# Patient Record
Sex: Female | Born: 1960 | Race: White | Hispanic: No | Smoking: Never smoker
Health system: Southern US, Community
[De-identification: ages and names within clinical notes are randomized; demographics above are authoritative.]

## PROBLEM LIST (undated history)

## (undated) DIAGNOSIS — F419 Anxiety disorder, unspecified: Secondary | ICD-10-CM

## (undated) DIAGNOSIS — E785 Hyperlipidemia, unspecified: Secondary | ICD-10-CM

## (undated) DIAGNOSIS — M722 Plantar fascial fibromatosis: Secondary | ICD-10-CM

## (undated) DIAGNOSIS — R7303 Prediabetes: Secondary | ICD-10-CM

## (undated) HISTORY — DX: Plantar fascial fibromatosis: M72.2

## (undated) HISTORY — DX: Hyperlipidemia, unspecified: E78.5

## (undated) HISTORY — DX: Anxiety disorder, unspecified: F41.9

## (undated) HISTORY — DX: Prediabetes: R73.03

---

## 2004-05-08 ENCOUNTER — Other Ambulatory Visit: Admission: RE | Admit: 2004-05-08 | Discharge: 2004-05-08 | Payer: Self-pay | Admitting: Obstetrics and Gynecology

## 2006-05-24 ENCOUNTER — Encounter: Admission: RE | Admit: 2006-05-24 | Discharge: 2006-05-24 | Payer: Self-pay | Admitting: Obstetrics and Gynecology

## 2006-06-11 ENCOUNTER — Other Ambulatory Visit: Admission: RE | Admit: 2006-06-11 | Discharge: 2006-06-11 | Payer: Self-pay | Admitting: Obstetrics and Gynecology

## 2007-02-03 ENCOUNTER — Ambulatory Visit (HOSPITAL_BASED_OUTPATIENT_CLINIC_OR_DEPARTMENT_OTHER): Admission: RE | Admit: 2007-02-03 | Discharge: 2007-02-03 | Payer: Self-pay | Admitting: Obstetrics & Gynecology

## 2007-02-03 HISTORY — PX: ABLATION: SHX5711

## 2007-06-03 ENCOUNTER — Encounter: Admission: RE | Admit: 2007-06-03 | Discharge: 2007-06-03 | Payer: Self-pay | Admitting: Obstetrics & Gynecology

## 2008-05-13 ENCOUNTER — Other Ambulatory Visit: Admission: RE | Admit: 2008-05-13 | Discharge: 2008-05-13 | Payer: Self-pay | Admitting: Obstetrics & Gynecology

## 2008-06-04 ENCOUNTER — Encounter: Admission: RE | Admit: 2008-06-04 | Discharge: 2008-06-04 | Payer: Self-pay | Admitting: Obstetrics & Gynecology

## 2009-06-07 ENCOUNTER — Encounter: Admission: RE | Admit: 2009-06-07 | Discharge: 2009-06-07 | Payer: Self-pay | Admitting: Obstetrics & Gynecology

## 2009-08-31 ENCOUNTER — Ambulatory Visit: Payer: Self-pay | Admitting: Sports Medicine

## 2009-08-31 DIAGNOSIS — M542 Cervicalgia: Secondary | ICD-10-CM | POA: Insufficient documentation

## 2009-08-31 DIAGNOSIS — M79609 Pain in unspecified limb: Secondary | ICD-10-CM | POA: Insufficient documentation

## 2009-08-31 DIAGNOSIS — M533 Sacrococcygeal disorders, not elsewhere classified: Secondary | ICD-10-CM | POA: Insufficient documentation

## 2010-06-21 ENCOUNTER — Encounter: Admission: RE | Admit: 2010-06-21 | Discharge: 2010-06-21 | Payer: Self-pay | Admitting: Obstetrics & Gynecology

## 2010-06-28 ENCOUNTER — Encounter: Admission: RE | Admit: 2010-06-28 | Discharge: 2010-06-28 | Payer: Self-pay | Admitting: Obstetrics & Gynecology

## 2011-03-30 NOTE — Op Note (Signed)
NAMEMordecai Gregory             ACCOUNT NO.:  [id number]   MEDICAL RECORD NO.:  [id number]          PATIENT TYPE:  AMB   LOCATION:  [facility]                         FACILITY:  [facility]   PHYSICIAN:  M. Leda Quail, MD  DATE OF BIRTH:  [birth year]   DATE OF PROCEDURE:  02/03/2007  DATE OF DISCHARGE:                               OPERATIVE REPORT   PREOPERATIVE DIAGNOSIS:  37. 50 year old G1 P1 married white female with menorrhagia.  2. Endometrial polyps on sonohysterogram.   POSTOPERATIVE DIAGNOSIS:  Menorrhagia and regression of endometrial  polyps after administration of Depo-Lupron.   PROCEDURE:  HTA   SURGEON:  M. Leda Quail, M.D.   ASSISTANT:  OR staff.   ANESTHESIA:  MAC.   SPECIMENS:  None.   ESTIMATED BLOOD LOSS:  Minimal.   FLUIDS:  1000 mL of LR.   URINE OUTPUT:  100 mL of clear urine output drained with Foley catheter  during the procedure.   INDICATIONS:  Ms. Lindsay Gregory is a 50 year old married white female who has  had one pregnancy and one child, and also has a history of menorrhagia.  She has undergone workup including a sonohysterogram which showed  several sessile polyps and an endometrial biopsy that showed benign  proliferative endometrium.  The patient was counseled on different  treatment options and she opted to proceed with an ablation because  there was evidence of polyps present on sonohysterogram, I recommended  hydrothermal ablation.  She was pretreated with Depo-Lupron and had a  presurgical ultrasound showing a very thin endometrium at 2 mm.  The  patient was also counseled very clearly about the fact that she needed  not to get pregnant in future.  She is married but due to circumstances  in her relationship birth control is not needed and she has opted not to  go ahead and have a tubal ligation at the same time.  The patient and I  are both comfortable with the decision and again she is very aware that  should her circumstance change, she  will need a more permanent method of  birth control.   PROCEDURE:  The patient is taken to operating room.  She is placed in  supine position.  Anesthesia is administered by the anesthesia staff  without difficulty.  Legs positioned in the low lithotomy position in  the [place] stirrups.  Then positioned high lithotomy position.  Perineum,  inner thighs, vagina prepped and draped in normal sterile fashion.  Red  rubber Foley catheter used to drain the bladder of all urine.   Bivalve speculum placed in vagina.  Cervix was well visualized. Anterior  lip of the cervix was grasped single-tooth tenaculum.  Then 1% lidocaine  mixed with epinephrine (1:200,000 units) instilled in the cervix.  10 mL  was instilled in total.  2.5 mL instilled at the 3, 6, 9, 12 o'clock  positions.  After paracervical block is placed the uterus sounded to 6.5  cm.  Then using Pratt dilators cervix dilated to #23.  At this point  attempt is made to pass the hysteroscope with HTA apparatus attached  through the  endocervical canal.  This was not possible so the cervix was  then dilated to #25.  At this point the high hysteroscope with the HTA  apparatus attached was advanced slowly through the cervical os with what  felt to be a tight seal present.  Photo documentation of endometrial  cavity was made particularly the tubal ostia.  Then the scope was placed  in the lower uterine portion of the uterus and then the tenaculum was  attached to the HTA apparatus.  At this point the heating cycle was  initiated, the hysteroscope and fluid normal saline heated to  approximately 81 degree Celsius.  There is no leaking at all around the  hysteroscope and the reservoir was stable at 81 mL of fluid.  Once the  heating cycle was complete, the ablation cycle was performed for 10  minutes.  No leaking from around the cervix was noted.  The resevoir was  stable throughout the entire procedure.  After the ablation cycle was  complete,  the system cooled and after appropriate cooling cycle the  hysteroscope could be removed from the cervix.  Photo documentation was  made before removal of the hysteroscope.  The tenaculum was removed from  the cervix.  This point no bleeding was noted.  The speculum was removed  from the vagina and no bleeding was noted.  Then skin was cleansed of  the Betadine and legs positioned in supine position again.  The patient  was awakened from anesthesia.  Sponge, lap and instrument counts were  correct x2.  There were no needles on the field.  The patient did well  and was taken to recovery room in stable condition.      Lindsay Keas, MD  Electronically Signed     MSM/MEDQ  D:  02/03/2007  T:  02/03/2007  Job:  [id number]

## 2011-05-28 ENCOUNTER — Other Ambulatory Visit: Payer: Self-pay | Admitting: Obstetrics & Gynecology

## 2011-05-28 DIAGNOSIS — Z1231 Encounter for screening mammogram for malignant neoplasm of breast: Secondary | ICD-10-CM

## 2011-07-02 ENCOUNTER — Ambulatory Visit
Admission: RE | Admit: 2011-07-02 | Discharge: 2011-07-02 | Disposition: A | Discharge status: Home / Self Care | Payer: BC Managed Care – PPO | Source: Ambulatory Visit | Attending: Obstetrics & Gynecology | Admitting: Obstetrics & Gynecology

## 2011-07-02 DIAGNOSIS — Z1231 Encounter for screening mammogram for malignant neoplasm of breast: Secondary | ICD-10-CM

## 2011-11-13 HISTORY — PX: CERVICAL SPINE SURGERY: SHX589

## 2012-01-22 ENCOUNTER — Other Ambulatory Visit: Payer: Self-pay | Admitting: Neurosurgery

## 2012-01-22 ENCOUNTER — Ambulatory Visit
Admission: RE | Admit: 2012-01-22 | Discharge: 2012-01-22 | Disposition: A | Discharge status: Home / Self Care | Payer: BC Managed Care – PPO | Source: Ambulatory Visit | Attending: Neurosurgery | Admitting: Neurosurgery

## 2012-01-22 DIAGNOSIS — M542 Cervicalgia: Secondary | ICD-10-CM

## 2014-05-17 ENCOUNTER — Other Ambulatory Visit: Payer: Self-pay

## 2014-05-17 DIAGNOSIS — Z1231 Encounter for screening mammogram for malignant neoplasm of breast: Secondary | ICD-10-CM

## 2014-06-24 ENCOUNTER — Encounter: Payer: Self-pay | Admitting: Certified Nurse Midwife

## 2014-06-24 ENCOUNTER — Ambulatory Visit (INDEPENDENT_AMBULATORY_CARE_PROVIDER_SITE_OTHER): Payer: BC Managed Care – PPO | Admitting: Certified Nurse Midwife

## 2014-06-24 ENCOUNTER — Ambulatory Visit
Admission: RE | Admit: 2014-06-24 | Discharge: 2014-06-24 | Disposition: A | Discharge status: Home / Self Care | Payer: BC Managed Care – PPO | Source: Ambulatory Visit

## 2014-06-24 VITALS — BP 102/70 | HR 60 | Resp 16 | Ht 65.5 in | Wt 184.0 lb

## 2014-06-24 DIAGNOSIS — N951 Menopausal and female climacteric states: Secondary | ICD-10-CM

## 2014-06-24 DIAGNOSIS — Z124 Encounter for screening for malignant neoplasm of cervix: Secondary | ICD-10-CM

## 2014-06-24 DIAGNOSIS — Z0000 Encounter for general adult medical examination without abnormal findings: Secondary | ICD-10-CM

## 2014-06-24 DIAGNOSIS — Z1231 Encounter for screening mammogram for malignant neoplasm of breast: Secondary | ICD-10-CM

## 2014-06-24 DIAGNOSIS — Z01419 Encounter for gynecological examination (general) (routine) without abnormal findings: Secondary | ICD-10-CM

## 2014-06-24 DIAGNOSIS — Z1211 Encounter for screening for malignant neoplasm of colon: Secondary | ICD-10-CM

## 2014-06-24 LAB — POCT URINALYSIS DIPSTICK
Bilirubin, UA: NEGATIVE
GLUCOSE UA: NEGATIVE
Ketones, UA: NEGATIVE
LEUKOCYTES UA: NEGATIVE
NITRITE UA: NEGATIVE
PROTEIN UA: NEGATIVE
RBC UA: NEGATIVE
Urobilinogen, UA: NEGATIVE
pH, UA: 5

## 2014-06-24 NOTE — Progress Notes (Signed)
53 y.o. Single Caucasian g1p1001 Fe here to re- establish gyn care and for annual exam. Periods none with ablation (6-7 years ago). Patient has had no periods, since ablation. Occasional hot flash and night sweats. Patient sees PCP for aex/labs, recent cardiac evaluation for palpitations, all negative. No health issues today. Son in [place] and [place].  No LMP recorded. Patient has had an ablation.          Sexually active: No.  The current method of family planning is Ablation.    Exercising: Yes.    Walking Smoker:  no  Health Maintenance: Pap: 2012 negative, no abnormals MMG:  Today 08/13  Colonoscopy:  N/A BMD:   N/A TDaP: up to date. - Per pt per health department Labs: PCP ZO:XWRUEA:Clear   reports that she has never smoked. She has never used smokeless tobacco. She reports that she drinks about .5 ounces of alcohol per week. She reports that she does not use illicit drugs.  Past Medical History  Diagnosis Date  . Anxiety     Past Surgical History  Procedure Laterality Date  . Ablation    . Cervical spine surgery  2013    Current Outpatient Prescriptions  Medication Sig Dispense Refill  . CLONAZEPAM PO Take by mouth as needed.      . fluticasone (FLONASE) 50 MCG/ACT nasal spray Place 1 spray into both nostrils daily.        No current facility-administered medications for this visit.    History reviewed. No pertinent family history.  ROS:  Pertinent items are noted in HPI.  Otherwise, a comprehensive ROS was negative.  Exam:   BP 102/70  Pulse 60  Resp 16  Ht 5' 5.5" (1.664 m)  Wt 184 lb (83.462 kg)  BMI 30.14 kg/m2 Height: 5' 5.5" (166.4 cm)  Ht Readings from Last 3 Encounters:  06/24/14 5' 5.5" (1.664 m)  08/31/09 5' 5.5" (1.664 m)    General appearance: alert, cooperative and appears stated age Head: Normocephalic, without obvious abnormality, atraumatic Neck: no adenopathy, supple, symmetrical, trachea midline and thyroid  Lungs: clear to auscultation  bilaterally Breasts: normal appearance, no masses or tenderness, No nipple retraction or dimpling, No nipple discharge or bleeding, No axillary or supraclavicular adenopathy Heart: regular rate and rhythm Abdomen: soft, non-tender; no masses,  no organomegaly Extremities: extremities normal, atraumatic, no cyanosis or edema Skin: Skin color, texture, turgor normal. No rashes or lesions Lymph nodes: Cervical, supraclavicular, and axillary nodes normal. No abnormal inguinal nodes palpated Neurologic: Grossly normal   Pelvic: External genitalia:  no lesions              Urethra:  normal appearing urethra with no masses, tenderness or lesions              Bartholin's and Skene's: normal                 Vagina: normal appearing vagina with normal color and discharge, no lesions              Cervix: normal, non tender              Pap taken: Yes.   Bimanual Exam:  Uterus:  normal size, contour, position, consistency, mobility, non-tender and anteverted              Adnexa: normal adnexa and no mass, fullness, tenderness               Rectovaginal: Confirms  Anus:  normal sphincter tone, no lesions  A:  Well Woman with normal exam  Menopausal?   Mammogram this am  Colonoscopy due  P:   Reviewed health and wellness pertinent to exam  Discussed etiology and expectations, with weight and symptoms.   Lab: FSH, Vitamin D  Discussed risks and benefits of colonoscopy, declines scheduling today. IFOB dispensed.  Pap smear taken today with HPVHR   counseled on breast self exam, mammography screening, adequate intake of calcium and vitamin D, diet and exercise  return annually or prn  An After Visit Summary was printed and given to the patient.

## 2014-06-24 NOTE — Patient Instructions (Signed)

## 2014-06-25 LAB — VITAMIN D 25 HYDROXY (VIT D DEFICIENCY, FRACTURES): VIT D 25 HYDROXY: 31 ng/mL (ref 30–89)

## 2014-06-25 LAB — FOLLICLE STIMULATING HORMONE: FSH: 56.5 m[IU]/mL

## 2014-06-25 NOTE — Progress Notes (Signed)
Reviewed personally.  M. Suzanne Carey Johndrow, MD.  

## 2014-06-29 LAB — IPS PAP TEST WITH HPV

## 2014-09-13 ENCOUNTER — Encounter: Payer: Self-pay | Admitting: Certified Nurse Midwife

## 2015-07-01 ENCOUNTER — Encounter: Payer: Self-pay | Admitting: Nurse Practitioner

## 2015-07-01 ENCOUNTER — Telehealth: Payer: Self-pay | Admitting: Nurse Practitioner

## 2015-07-01 ENCOUNTER — Ambulatory Visit (INDEPENDENT_AMBULATORY_CARE_PROVIDER_SITE_OTHER): Payer: BC Managed Care – PPO | Admitting: Nurse Practitioner

## 2015-07-01 VITALS — BP 108/76 | HR 60 | Ht 65.5 in | Wt 193.0 lb

## 2015-07-01 DIAGNOSIS — N760 Acute vaginitis: Secondary | ICD-10-CM | POA: Diagnosis not present

## 2015-07-01 DIAGNOSIS — Z0000 Encounter for general adult medical examination without abnormal findings: Secondary | ICD-10-CM

## 2015-07-01 DIAGNOSIS — N951 Menopausal and female climacteric states: Secondary | ICD-10-CM | POA: Diagnosis not present

## 2015-07-01 DIAGNOSIS — Z01419 Encounter for gynecological examination (general) (routine) without abnormal findings: Secondary | ICD-10-CM

## 2015-07-01 LAB — POCT URINALYSIS DIPSTICK
BILIRUBIN UA: NEGATIVE
GLUCOSE UA: NEGATIVE
KETONES UA: NEGATIVE
Leukocytes, UA: NEGATIVE
Nitrite, UA: NEGATIVE
PH UA: 6.5
Protein, UA: NEGATIVE
RBC UA: NEGATIVE
Urobilinogen, UA: NEGATIVE

## 2015-07-01 LAB — HEMOGLOBIN, FINGERSTICK: HEMOGLOBIN, FINGERSTICK: 12.2 g/dL (ref 12.0–16.0)

## 2015-07-01 NOTE — Progress Notes (Signed)
Patient ID: Lindsay Gregory, female   DOB: 11-21-1960, 54 y.o.   MRN: [id number] 54 y.o. G45P1001 Divorced Caucasian Fe here for annual exam.  Still having vaso symptoms with increase in warmth.  Sleep is good. Recently some lower abdomen pain that felt like ovulation and mild breast tenderness.  She is unsure if it may be related to IBS with school starting back and trying to get organized.  She does note a vaginal odor or slight vaginitis symptoms.   Not dating or SA for over a year.  Patient's last menstrual period was 02/03/2007.          Sexually active: No.  The current method of family planning is ablation.    Exercising: Yes.    walking Smoker:  no  Health Maintenance: Pap:  06/24/14, Negative with neg HR HPV MMG: 06/24/14, Bi-Rads 1:  Negative  Colonoscopy: Never TDaP:  UTD done 2013 - Before trip to [place] Labs: 12.2  Urine:  Negative    reports that she has never smoked. She has never used smokeless tobacco. She reports that she drinks about 0.5 oz of alcohol per week. She reports that she does not use illicit drugs.  Past Medical History  Diagnosis Date  . Anxiety     Past Surgical History  Procedure Laterality Date  . Ablation  02/03/2007  . Cervical spine surgery  2013    Current Outpatient Prescriptions  Medication Sig Dispense Refill  . CLONAZEPAM PO Take by mouth as needed.    . fluticasone (FLONASE) 50 MCG/ACT nasal spray Place 1 spray into both nostrils daily.      No current facility-administered medications for this visit.    History reviewed. No pertinent family history.  ROS:  Pertinent items are noted in HPI.  Otherwise, a comprehensive ROS was negative.  Exam:   BP 108/76 mmHg  Pulse 60  Ht 5' 5.5" (1.664 m)  Wt 193 lb (87.544 kg)  BMI 31.62 kg/m2  LMP 02/03/2007 Height: 5' 5.5" (166.4 cm) Ht Readings from Last 3 Encounters:  07/01/15 5' 5.5" (1.664 m)  06/24/14 5' 5.5" (1.664 m)  08/31/09 5' 5.5" (1.664 m)    General appearance:  alert, cooperative and appears stated age Head: Normocephalic, without obvious abnormality, atraumatic Neck: no adenopathy, supple, symmetrical, trachea midline and thyroid normal to inspection and palpation Lungs: clear to auscultation bilaterally Breasts: normal appearance, no masses or tenderness Heart: regular rate and rhythm Abdomen: soft, non-tender; no masses,  no organomegaly Extremities: extremities normal, atraumatic, no cyanosis or edema Skin: Skin color, texture, turgor normal. No rashes or lesions Lymph nodes: Cervical, supraclavicular, and axillary nodes normal. No abnormal inguinal nodes palpated Neurologic: Grossly normal   Pelvic: External genitalia:  no lesions              Urethra:  normal appearing urethra with no masses, tenderness or lesions              Bartholin's and Skene's: normal                 Vagina: normal appearing vagina with normal color and very little discharge, no lesions              Cervix: anteverted              Pap taken: Yes.   Bimanual Exam:  Uterus:  normal size, contour, position, consistency, mobility, non-tender              Adnexa: no mass,  fullness, tenderness               Rectovaginal: Confirms               Anus:  normal sphincter tone, no lesions  Chaperone present:  yes  A:  Well Woman with normal exam  Peri Menopausal  S/P Ablation 02/03/2007 Colonoscopy is due  R/O vaginitis  P:   Reviewed health and wellness pertinent to exam  Pap smear as above  Mammogram is due 06/2015  Will recheck [facility]  Will follow with Affirm test  She has such fear about having the colonoscopy procedure done that she will not allow [place] to schedule and declines IFOB  Counseled on breast self exam, mammography screening, adequate intake of calcium and vitamin D, diet and exercise return annually or prn  An After Visit Summary was printed and given to the patient.

## 2015-07-01 NOTE — Telephone Encounter (Signed)
Patient was seen today and has a question regarding the blood work that was done.

## 2015-07-01 NOTE — Patient Instructions (Signed)

## 2015-07-01 NOTE — Telephone Encounter (Signed)
Spoke with patient. Patient is asking if her "organ function" is being tested with her lab work. Advised only labs performed today were FSH and Hemoglobin. Advised additional lab work may be added if she would like to have additional levels checked. Patient declines. Patient would like to have the remainder of her lab work performed with her PCP. Will return call if she has any further questions.  Routing to provider for final review. Patient agreeable to disposition. Will close encounter.   Patient aware provider will review message and nurse will return call if any additional advice or change of disposition.

## 2015-07-02 LAB — WET PREP BY MOLECULAR PROBE
CANDIDA SPECIES: NEGATIVE
Gardnerella vaginalis: NEGATIVE
Trichomonas vaginosis: NEGATIVE

## 2015-07-02 LAB — FOLLICLE STIMULATING HORMONE: FSH: 38 m[IU]/mL

## 2015-07-03 NOTE — Progress Notes (Signed)
Encounter reviewed by Dr. Brook Amundson C. Silva.  

## 2015-07-05 LAB — IPS PAP TEST WITH HPV

## 2016-06-22 ENCOUNTER — Other Ambulatory Visit: Payer: Self-pay | Admitting: Family Medicine

## 2016-06-22 ENCOUNTER — Other Ambulatory Visit: Payer: Self-pay | Admitting: Obstetrics & Gynecology

## 2016-06-22 DIAGNOSIS — Z1231 Encounter for screening mammogram for malignant neoplasm of breast: Secondary | ICD-10-CM

## 2016-07-06 ENCOUNTER — Ambulatory Visit: Payer: BC Managed Care – PPO

## 2016-07-06 ENCOUNTER — Ambulatory Visit: Payer: BC Managed Care – PPO | Admitting: Nurse Practitioner

## 2016-08-29 ENCOUNTER — Telehealth: Payer: Self-pay | Admitting: Nurse Practitioner

## 2016-08-29 NOTE — Telephone Encounter (Signed)
LMTCB about canceled appt °

## 2016-09-21 ENCOUNTER — Ambulatory Visit: Payer: BC Managed Care – PPO

## 2016-09-21 ENCOUNTER — Ambulatory Visit: Payer: BC Managed Care – PPO | Admitting: Nurse Practitioner

## 2016-11-02 ENCOUNTER — Ambulatory Visit: Payer: BC Managed Care – PPO | Admitting: Nurse Practitioner

## 2016-11-02 ENCOUNTER — Ambulatory Visit: Payer: BC Managed Care – PPO

## 2017-02-27 ENCOUNTER — Ambulatory Visit (INDEPENDENT_AMBULATORY_CARE_PROVIDER_SITE_OTHER): Payer: BC Managed Care – PPO | Admitting: Sports Medicine

## 2017-02-27 ENCOUNTER — Encounter: Payer: Self-pay | Admitting: Sports Medicine

## 2017-02-27 ENCOUNTER — Ambulatory Visit (INDEPENDENT_AMBULATORY_CARE_PROVIDER_SITE_OTHER): Payer: BC Managed Care – PPO

## 2017-02-27 DIAGNOSIS — M7671 Peroneal tendinitis, right leg: Secondary | ICD-10-CM | POA: Diagnosis not present

## 2017-02-27 DIAGNOSIS — M79671 Pain in right foot: Secondary | ICD-10-CM | POA: Diagnosis not present

## 2017-02-27 DIAGNOSIS — M722 Plantar fascial fibromatosis: Secondary | ICD-10-CM

## 2017-02-27 MED ORDER — TRIAMCINOLONE ACETONIDE 10 MG/ML IJ SUSP: 10.0000 mg | Freq: Once | INTRAMUSCULAR | Status: DC

## 2017-02-27 MED ORDER — DICLOFENAC SODIUM 75 MG PO TBEC: 75.0000 mg | DELAYED_RELEASE_TABLET | Freq: Two times a day (BID) | ORAL | 0 refills | Status: DC

## 2017-02-27 NOTE — Progress Notes (Signed)
Subjective: Lindsay Gregory is a 56 y.o. female patient presents to office with complaint of heel pain on the right at the side and bottom. Patient admits to post static dyskinesia for 2 months in duration but the side of foot pain was 1st. Patient has treated this problem with change of shoes and stretching with no relief. Denies any other pedal complaints.   Reports had diclofenac in the past that helped.   Patient works as a Pharmacist, hospital.   Patient Active Problem List   Diagnosis Date Noted  . NECK PAIN 08/31/2009  . 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 FOOT PAIN, RIGHT 08/31/2009    Current Outpatient Prescriptions on File Prior to Visit  Medication Sig Dispense Refill  . CLONAZEPAM PO Take by mouth as needed.    . fluticasone (FLONASE) 50 MCG/ACT nasal spray Place 1 spray into both nostrils daily.      No current facility-administered medications on file prior to visit.     No Known Allergies  Objective: Physical Exam General: The patient is alert and oriented x3 in no acute distress.  Dermatology: Skin is warm, dry and supple bilateral lower extremities. Nails 1-10 are normal. There is no erythema, edema, no eccymosis, no open lesions present. Integument is otherwise unremarkable.  Vascular: Dorsalis Pedis pulse and Posterior Tibial pulse are 2/4 bilateral. Capillary fill time is immediate to all digits.  Neurological: Grossly intact to light touch with an achilles reflex of +2/5 and a  negative Tinel's sign bilateral.  Musculoskeletal: Tenderness to palpation at peroneal insertion at the 5th met base and the medial calcaneal tubercale and through the insertion of the plantar fascia on the right foot. No pain with compression of calcaneus bilateral. No pain with tuning fork to calcaneus bilateral. No pain with calf compression bilateral. There is decreased Ankle joint range of motion bilateral. All other joints range of motion within normal limits bilateral. Strength 5/5  in all groups bilateral.   Gait: Unassisted, Antalgic avoid weight on right heel  Xray, Right foot:  Normal osseous mineralization. Joint spaces preserved. No fracture/dislocation/boney destruction. Calcaneal spur present with mild thickening of plantar fascia. No other soft tissue abnormalities or radiopaque foreign bodies.   Assessment and Plan: Problem List Items Addressed This Visit      Other   FOOT PAIN, RIGHT   Relevant Medications   diclofenac (VOLTAREN) 75 MG EC tablet   triamcinolone acetonide (KENALOG) 10 MG/ML injection 10 mg    Other Visit Diagnoses    Peroneal tendonitis of right lower extremity    -  Primary   Relevant Medications   diclofenac (VOLTAREN) 75 MG EC tablet   triamcinolone acetonide (KENALOG) 10 MG/ML injection 10 mg   Plantar fasciitis of right foot       Relevant Medications   diclofenac (VOLTAREN) 75 MG EC tablet   Other Relevant Orders   DG Foot Complete Right      -Complete examination performed.  -Xrays reviewed -Discussed with patient in detail the condition of tendonitis and plantar fasciitis, how this occurs and general treatment options. Explained both conservative and surgical treatments.  -After oral consent and aseptic prep, injected a mixture containing 1 ml of 2%  plain lidocaine, 1 ml 0.5% plain marcaine, 0.5 ml of kenalog 10 and 0.5 ml of dexamethasone phosphate into right 5th met base at area of most pain. Post-injection care discussed with patient.  -Rx Diclofenac to take as instructed  -Recommended good supportive shoes and advised use of OTC  insert. Explained to patient that if these orthoses work well, we will continue with these. If these do not improve her condition and  pain, we will consider custom molded orthoses. - Explained in detail the use of the fascial brace for right which was dispensed at today's visit. -Explained and dispensed to patient daily stretching exercises. -Recommend patient to ice affected area 1-2x  daily. -Patient to return to office in 3-4 weeks for follow up or sooner if problems or questions arise. Advised patient at next visit may injection plantar fascia is pain is no better in that area at next visit.  Landis Martins, DPM

## 2017-02-27 NOTE — Patient Instructions (Signed)

## 2017-02-27 NOTE — Progress Notes (Signed)
   Subjective:    Patient ID: Lindsay Gregory, female    DOB: [birth year], 56 y.o.   MRN: [id number]  HPI   I have some right heel pain and has been going on for about 2 months and stand all day and throbs at night and hurts am and is sore and tender, burns and throbs and I have tried different type of shoes    Review of Systems  All other systems reviewed and are negative.      Objective:   Physical Exam        Assessment & Plan:

## 2017-03-20 ENCOUNTER — Ambulatory Visit: Payer: BC Managed Care – PPO | Admitting: Sports Medicine

## 2017-04-26 ENCOUNTER — Ambulatory Visit: Payer: BC Managed Care – PPO | Admitting: Nurse Practitioner

## 2017-04-26 ENCOUNTER — Ambulatory Visit: Payer: BC Managed Care – PPO

## 2017-05-08 ENCOUNTER — Ambulatory Visit: Payer: BC Managed Care – PPO

## 2017-05-08 ENCOUNTER — Ambulatory Visit: Payer: BC Managed Care – PPO | Admitting: Nurse Practitioner

## 2017-05-09 ENCOUNTER — Ambulatory Visit: Payer: BC Managed Care – PPO | Admitting: Sports Medicine

## 2017-05-13 ENCOUNTER — Telehealth: Payer: Self-pay | Admitting: Nurse Practitioner

## 2017-05-13 NOTE — Telephone Encounter (Signed)
Left message regarding upcoming appointment has been canceled and needs to be rescheduled. °

## 2017-05-21 ENCOUNTER — Ambulatory Visit: Payer: BC Managed Care – PPO | Admitting: Nurse Practitioner

## 2017-05-21 ENCOUNTER — Ambulatory Visit: Payer: BC Managed Care – PPO

## 2017-05-21 NOTE — Progress Notes (Signed)
56 y.o. [id number] Divorced  Caucasian Fe here for annual exam. Menopausal no HRT. Denies vaginal bleeding or vaginal dryness. Sees PCP Kristen Cox prn for medication management. Occasional l vertigo and has been treated as needed with PCP. Has noted sore area in rectal area, recently constipated due to change in diet. ? Hemorrhoid. Denies bleeding or severe pain. Has been dealing with plantar fascitis, but seems to be improving.  Screening labs today if needed. No other health issues today.  No LMP recorded. Patient has had an ablation.          Sexually active: No.  The current method of family planning is abstinence.    Exercising: Yes.    walking Smoker:  no  Health Maintenance: Pap:  06-24-14 neg HPV HR neg, 07-01-15 neg HPV HR neg History of Abnormal Pap: no MMG:  06-24-14 category c density birads 1:neg, done today Self Breast exams: yes Colonoscopy: not at this point. BMD:   none TDaP:  2013 Shingles: no Pneumonia: no Hep C and HIV: during pregnancy checked for HIV Labs: none   reports that she has never smoked. She has never used smokeless tobacco. She reports that she does not drink alcohol or use drugs.  Past Medical History:  Diagnosis Date  . Anxiety   . Plantar fasciitis     Past Surgical History:  Procedure Laterality Date  . ABLATION  02/03/2007  . CERVICAL SPINE SURGERY  2013    Current Outpatient Prescriptions  Medication Sig Dispense Refill  . CLONAZEPAM PO Take by mouth as needed.    . diclofenac (VOLTAREN) 75 MG EC tablet Take 1 tablet (75 mg total) by mouth 2 (two) times daily. 30 tablet 0  . fluticasone (FLONASE) 50 MCG/ACT nasal spray Place 1 spray into both nostrils daily.      No current facility-administered medications for this visit.     History reviewed. No pertinent family history.  ROS:  Pertinent items are noted in HPI.  Otherwise, a comprehensive ROS was negative.  Exam:   BP 118/74   Pulse 70   Resp 16   Ht 5' 5.25" (1.657 m)   Wt 195  lb (88.5 kg)   BMI 32.20 kg/m  Height: 5' 5.25" (165.7 cm) Ht Readings from Last 3 Encounters:  05/22/17 5' 5.25" (1.657 m)  07/01/15 5' 5.5" (1.664 m)  06/24/14 5' 5.5" (1.664 m)    General appearance: alert, cooperative and appears stated age Head: Normocephalic, without obvious abnormality, atraumatic Neck: no adenopathy, supple, symmetrical, trachea midline and thyroid normal to inspection and palpation Lungs: clear to auscultation bilaterally Breasts: normal appearance, no masses or tenderness, No nipple retraction or dimpling, No nipple discharge or bleeding, No axillary or supraclavicular adenopathy Heart: regular rate and rhythm Abdomen: soft, non-tender; no masses,  no organomegaly Extremities: extremities normal, atraumatic, no cyanosis or edema Skin: Skin color, texture, turgor normal. No rashes or lesions Lymph nodes: Cervical, supraclavicular, and axillary nodes normal. No abnormal inguinal nodes palpated Neurologic: Grossly normal   Pelvic: External genitalia:  no lesions              Urethra:  normal appearing urethra with no masses, tenderness or lesions              Bartholin's and Skene's: normal                 Vagina: normal appearing vagina with normal color and discharge, no lesions  Cervix: multiparous appearance, no cervical motion tenderness and no lesions              Pap taken: Yes.   Bimanual Exam:  Uterus:  normal size, contour, position, consistency, mobility, non-tender and retroverted              Adnexa: normal adnexa and no mass, fullness, tenderness               Rectovaginal: Confirms               Anus:  normal sphincter tone, no lesions, small non thrombosed hemorrhoid  Chaperone present: yes  A:  Well Woman with normal exam  Menopausal no HRT  Small anal hemorrhoid   Plantar fascitis history  Colonoscopy due  Screening labs  P:   Reviewed health and wellness pertinent to exam  Aware of need to evaluate if vaginal  bleeding  Discussed coconut oil use for vaginal dryness if occurs.  Discussed hemorrhoid finding and instructed to start on Colace stool softener, avoid hard stools which encourage hemorrhoid occurrence. Discussed using Balmex or coconut oil on hemorrhoid for protection. Warning signs given with hemorrhoids.  Continue follow with MD as indicated.  Discussed risks/benefits of colonoscopy,declines, IFOB dispensed  Lab: CBC,CMP, Lipid panel,TSH,Vitamin D  Pap smear: yes   counseled on breast self exam, mammography screening, feminine hygiene, adequate intake of calcium and vitamin D, diet and exercise  return annually or prn  An After Visit Summary was printed and given to the patient.

## 2017-05-22 ENCOUNTER — Other Ambulatory Visit (HOSPITAL_COMMUNITY)
Admission: RE | Admit: 2017-05-22 | Discharge: 2017-05-22 | Disposition: A | Discharge status: Home / Self Care | Payer: BC Managed Care – PPO | Source: Ambulatory Visit | Attending: Obstetrics & Gynecology | Admitting: Obstetrics & Gynecology

## 2017-05-22 ENCOUNTER — Encounter: Payer: Self-pay | Admitting: Certified Nurse Midwife

## 2017-05-22 ENCOUNTER — Ambulatory Visit
Admission: RE | Admit: 2017-05-22 | Discharge: 2017-05-22 | Disposition: A | Discharge status: Home / Self Care | Payer: BC Managed Care – PPO | Source: Ambulatory Visit | Attending: Obstetrics & Gynecology | Admitting: Obstetrics & Gynecology

## 2017-05-22 ENCOUNTER — Ambulatory Visit (INDEPENDENT_AMBULATORY_CARE_PROVIDER_SITE_OTHER): Payer: BC Managed Care – PPO | Admitting: Certified Nurse Midwife

## 2017-05-22 VITALS — BP 118/74 | HR 70 | Resp 16 | Ht 65.25 in | Wt 195.0 lb

## 2017-05-22 DIAGNOSIS — Z1231 Encounter for screening mammogram for malignant neoplasm of breast: Secondary | ICD-10-CM

## 2017-05-22 DIAGNOSIS — Z0000 Encounter for general adult medical examination without abnormal findings: Secondary | ICD-10-CM

## 2017-05-22 DIAGNOSIS — Z01419 Encounter for gynecological examination (general) (routine) without abnormal findings: Secondary | ICD-10-CM | POA: Diagnosis not present

## 2017-05-22 DIAGNOSIS — Z1211 Encounter for screening for malignant neoplasm of colon: Secondary | ICD-10-CM

## 2017-05-22 DIAGNOSIS — Z124 Encounter for screening for malignant neoplasm of cervix: Secondary | ICD-10-CM | POA: Diagnosis not present

## 2017-05-22 NOTE — Patient Instructions (Signed)

## 2017-05-23 ENCOUNTER — Other Ambulatory Visit: Payer: Self-pay

## 2017-05-23 ENCOUNTER — Other Ambulatory Visit: Payer: Self-pay | Admitting: Certified Nurse Midwife

## 2017-05-23 ENCOUNTER — Telehealth: Payer: Self-pay

## 2017-05-23 ENCOUNTER — Other Ambulatory Visit: Payer: Self-pay | Admitting: Obstetrics & Gynecology

## 2017-05-23 DIAGNOSIS — E559 Vitamin D deficiency, unspecified: Secondary | ICD-10-CM

## 2017-05-23 DIAGNOSIS — R928 Other abnormal and inconclusive findings on diagnostic imaging of breast: Secondary | ICD-10-CM

## 2017-05-23 DIAGNOSIS — R899 Unspecified abnormal finding in specimens from other organs, systems and tissues: Secondary | ICD-10-CM

## 2017-05-23 LAB — COMPREHENSIVE METABOLIC PANEL
ALBUMIN: 4.7 g/dL (ref 3.5–5.5)
ALK PHOS: 74 IU/L (ref 39–117)
ALT: 16 IU/L (ref 0–32)
AST: 20 IU/L (ref 0–40)
Albumin/Globulin Ratio: 2 (ref 1.2–2.2)
BILIRUBIN TOTAL: 0.5 mg/dL (ref 0.0–1.2)
BUN / CREAT RATIO: 13 (ref 9–23)
BUN: 14 mg/dL (ref 6–24)
CO2: 24 mmol/L (ref 20–29)
CREATININE: 1.11 mg/dL — AB (ref 0.57–1.00)
Calcium: 9.8 mg/dL (ref 8.7–10.2)
Chloride: 103 mmol/L (ref 96–106)
GFR calc non Af Amer: 56 mL/min/{1.73_m2} — ABNORMAL LOW (ref >59)
GFR, EST AFRICAN AMERICAN: 65 mL/min/{1.73_m2} (ref >59)
GLOBULIN, TOTAL: 2.4 g/dL (ref 1.5–4.5)
GLUCOSE: 93 mg/dL (ref 65–99)
Potassium: 4.6 mmol/L (ref 3.5–5.2)
SODIUM: 141 mmol/L (ref 134–144)
TOTAL PROTEIN: 7.1 g/dL (ref 6.0–8.5)

## 2017-05-23 LAB — CBC
HEMATOCRIT: 39.2 % (ref 34.0–46.6)
Hemoglobin: 12.8 g/dL (ref 11.1–15.9)
MCH: 28.3 pg (ref 26.6–33.0)
MCHC: 32.7 g/dL (ref 31.5–35.7)
MCV: 87 fL (ref 79–97)
PLATELETS: 278 10*3/uL (ref 150–379)
RBC: 4.53 x10E6/uL (ref 3.77–5.28)
RDW: 14.9 % (ref 12.3–15.4)
WBC: 6.8 10*3/uL (ref 3.4–10.8)

## 2017-05-23 LAB — VITAMIN D 25 HYDROXY (VIT D DEFICIENCY, FRACTURES): VIT D 25 HYDROXY: 27.6 ng/mL — AB (ref 30.0–100.0)

## 2017-05-23 LAB — LIPID PANEL
CHOLESTEROL TOTAL: 189 mg/dL (ref 100–199)
Chol/HDL Ratio: 3.7 ratio (ref 0.0–4.4)
HDL: 51 mg/dL (ref >39)
LDL Calculated: 115 mg/dL — ABNORMAL HIGH (ref 0–99)
Triglycerides: 115 mg/dL (ref 0–149)
VLDL CHOLESTEROL CAL: 23 mg/dL (ref 5–40)

## 2017-05-23 LAB — CYTOLOGY - PAP: DIAGNOSIS: NEGATIVE

## 2017-05-23 LAB — TSH: TSH: 1.64 u[IU]/mL (ref 0.450–4.500)

## 2017-05-23 NOTE — Telephone Encounter (Signed)
-----   Message from Verner Choleborah S Leonard, CNM sent at 05/23/2017  8:19 AM EDT ----- Notify patient Vitamin D is low, Needs to start on Vitamin D 3 OTC 1000 IU daily and recheck in 3 months Order placed, please schedule TSH is normal Lipid panel is normal except for borderline elevation of LDL at 115, normal < 99 Work on healthy diet and regular daily exercise recheck at aex Liver and glucose profile normal, Creatinine clearance slightly elevated. Stop all OTC medications and recheck in two weeks Order placed please schedule All other profile with kidney normal CBC is normal no anemia Pap pending

## 2017-05-23 NOTE — Telephone Encounter (Signed)
lmtcb

## 2017-05-24 ENCOUNTER — Other Ambulatory Visit: Payer: Self-pay

## 2017-05-24 DIAGNOSIS — R899 Unspecified abnormal finding in specimens from other organs, systems and tissues: Secondary | ICD-10-CM

## 2017-05-24 DIAGNOSIS — E559 Vitamin D deficiency, unspecified: Secondary | ICD-10-CM

## 2017-05-24 NOTE — Telephone Encounter (Signed)
Patient returning call.

## 2017-05-24 NOTE — Telephone Encounter (Signed)
Patient notified of results. See lab 

## 2017-05-29 ENCOUNTER — Ambulatory Visit
Admission: RE | Admit: 2017-05-29 | Discharge: 2017-05-29 | Disposition: A | Discharge status: Home / Self Care | Payer: BC Managed Care – PPO | Source: Ambulatory Visit | Attending: Obstetrics & Gynecology | Admitting: Obstetrics & Gynecology

## 2017-05-29 ENCOUNTER — Other Ambulatory Visit: Payer: Self-pay | Admitting: Obstetrics & Gynecology

## 2017-05-29 DIAGNOSIS — R928 Other abnormal and inconclusive findings on diagnostic imaging of breast: Secondary | ICD-10-CM

## 2017-05-29 DIAGNOSIS — N6489 Other specified disorders of breast: Secondary | ICD-10-CM

## 2017-05-30 ENCOUNTER — Ambulatory Visit
Admission: RE | Admit: 2017-05-30 | Discharge: 2017-05-30 | Disposition: A | Discharge status: Home / Self Care | Payer: BC Managed Care – PPO | Source: Ambulatory Visit | Attending: Obstetrics & Gynecology | Admitting: Obstetrics & Gynecology

## 2017-05-30 DIAGNOSIS — N6489 Other specified disorders of breast: Secondary | ICD-10-CM

## 2017-06-26 ENCOUNTER — Telehealth: Payer: Self-pay

## 2017-06-26 NOTE — Telephone Encounter (Signed)
Called patient to see if she went to [facility] to have bloodwork done. lmtcb

## 2017-07-02 NOTE — Telephone Encounter (Signed)
No callback from patient. Okay to close encounter?  

## 2017-07-03 NOTE — Telephone Encounter (Signed)
Ok to close encounter. 

## 2017-08-29 ENCOUNTER — Telehealth: Payer: Self-pay

## 2017-08-29 NOTE — Telephone Encounter (Signed)
-----   Message from Verner Choleborah S Leonard, CNM sent at 08/29/2017  8:48 AM EDT ----- IFOB  Not completed, did [place] labs in [place]

## 2017-08-29 NOTE — Telephone Encounter (Signed)
Left message for patient to return my phone call regarding follow up.

## 2017-09-03 NOTE — Telephone Encounter (Signed)
Left detailed message to remind patient to do ifob & see if she had f/u labwork done.

## 2017-09-04 NOTE — Telephone Encounter (Signed)
Close encounter 

## 2017-09-04 NOTE — Telephone Encounter (Signed)
No callback from patient after several attempts. Please advise. 

## 2017-09-05 NOTE — Telephone Encounter (Signed)
Pt returned phone call & states she will do her ifob & turn it in. Pt states her pcp rechecked her labwork & pt will have them fax results to us. Pt given our fax number.

## 2017-12-17 ENCOUNTER — Telehealth: Payer: Self-pay | Admitting: *Deleted

## 2017-12-17 NOTE — Telephone Encounter (Signed)
Patient in 04 recall for 11/2017. Please contact patient regarding scheduling follow up breast imaging -eh

## 2017-12-17 NOTE — Telephone Encounter (Signed)
Left detailed message on patients voicemail for her to call & have her breast follow up scheduled & let us know when she is going to have it done.

## 2017-12-20 NOTE — Telephone Encounter (Signed)
Left message to callback with appt date

## 2017-12-24 NOTE — Telephone Encounter (Signed)
Called patient twice with no returned call. Routing to [place], [employer]LP[employer].

## 2017-12-24 NOTE — Telephone Encounter (Signed)
Patient has not returned call regarding 04 recall. Please advise on recall status/letter Thanks  

## 2018-01-01 ENCOUNTER — Encounter: Payer: Self-pay | Admitting: Obstetrics & Gynecology

## 2018-01-01 NOTE — Telephone Encounter (Signed)
Letter written.  I think this one needs to be sent certified mail.  Thanks.  Ok to remove from recall after certified letter sent.

## 2018-01-02 NOTE — Telephone Encounter (Signed)
Letter done- removed from recall -eh 

## 2018-04-28 ENCOUNTER — Other Ambulatory Visit: Payer: Self-pay | Admitting: Obstetrics & Gynecology

## 2018-04-28 DIAGNOSIS — Z1231 Encounter for screening mammogram for malignant neoplasm of breast: Secondary | ICD-10-CM

## 2018-05-23 ENCOUNTER — Encounter: Payer: Self-pay | Admitting: Certified Nurse Midwife

## 2018-05-23 ENCOUNTER — Ambulatory Visit
Admission: RE | Admit: 2018-05-23 | Discharge: 2018-05-23 | Disposition: A | Discharge status: Home / Self Care | Payer: BC Managed Care – PPO | Source: Ambulatory Visit | Attending: Obstetrics & Gynecology | Admitting: Obstetrics & Gynecology

## 2018-05-23 ENCOUNTER — Other Ambulatory Visit: Payer: Self-pay | Admitting: Obstetrics & Gynecology

## 2018-05-23 ENCOUNTER — Other Ambulatory Visit: Payer: Self-pay

## 2018-05-23 ENCOUNTER — Ambulatory Visit: Payer: BC Managed Care – PPO | Admitting: Certified Nurse Midwife

## 2018-05-23 VITALS — BP 120/70 | HR 74 | Ht 65.25 in | Wt 194.0 lb

## 2018-05-23 DIAGNOSIS — Z01419 Encounter for gynecological examination (general) (routine) without abnormal findings: Secondary | ICD-10-CM

## 2018-05-23 DIAGNOSIS — N6320 Unspecified lump in the left breast, unspecified quadrant: Secondary | ICD-10-CM

## 2018-05-23 DIAGNOSIS — Z1231 Encounter for screening mammogram for malignant neoplasm of breast: Secondary | ICD-10-CM

## 2018-05-23 DIAGNOSIS — N951 Menopausal and female climacteric states: Secondary | ICD-10-CM

## 2018-05-23 DIAGNOSIS — N6489 Other specified disorders of breast: Secondary | ICD-10-CM

## 2018-05-23 NOTE — Patient Instructions (Signed)

## 2018-05-23 NOTE — Progress Notes (Signed)
57 y.o. [id number] Divorced  Caucasian Fe here for annual exam. Denies vaginal bleeding. Experienced a slight pain on right several months ago for about a week and resolved. No change in bowels or bladder or vaginal discharge. Sees Dr. Sedalia Mutaox for aex and labs, all normal per patient. Had Vitamin D rechecked and normal, staying on supplement now. Undecided about colonoscopy or cologard, plans to make decision and advise soon. No other health issues today.  No LMP recorded. Patient has had an ablation.          Sexually active: No.  The current method of family planning is none, ablation. Exercising: Yes.    walking Smoker:  no  Health Maintenance: Pap:  07-01-15 neg HPV HR neg, 05-22-17 neg History of Abnormal Pap: no MMG:  Today-Category c density birads 2:neg Self Breast exams: yes Colonoscopy:  none BMD:   none TDaP:  2013 Shingles: no Pneumonia: no Hep C and HIV: HIV neg during pregnancy Labs: no   reports that she has never smoked. She has never used smokeless tobacco. She reports that she does not drink alcohol or use drugs.  Past Medical History:  Diagnosis Date  . Anxiety   . Plantar fasciitis     Past Surgical History:  Procedure Laterality Date  . ABLATION  02/03/2007  . CERVICAL SPINE SURGERY  2013    Current Outpatient Medications  Medication Sig Dispense Refill  . CLONAZEPAM PO Take by mouth as needed.    . diclofenac (VOLTAREN) 75 MG EC tablet Take 1 tablet (75 mg total) by mouth 2 (two) times daily. 30 tablet 0  . fluticasone (FLONASE) 50 MCG/ACT nasal spray Place 1 spray into both nostrils daily.     Marland Kitchen. UNKNOWN TO PATIENT Antibiotic for ear     No current facility-administered medications for this visit.     History reviewed. No pertinent family history.  ROS:  Pertinent items are noted in HPI.  Otherwise, a comprehensive ROS was negative.  Exam:   Ht 5' 5.25" (1.657 m)   Wt 194 lb (88 kg)   BMI 32.04 kg/m  Height: 5' 5.25" (165.7 cm) Ht Readings from Last 3  Encounters:  05/23/18 5' 5.25" (1.657 m)  05/22/17 5' 5.25" (1.657 m)  07/01/15 5' 5.5" (1.664 m)    General appearance: alert, cooperative and appears stated age Head: Normocephalic, without obvious abnormality, atraumatic Neck: no adenopathy, supple, symmetrical, trachea midline and thyroid normal to inspection and palpation Lungs: clear to auscultation bilaterally Breasts: normal appearance, no masses or tenderness, No nipple retraction or dimpling, No nipple discharge or bleeding, No axillary or supraclavicular adenopathy Heart: regular rate and rhythm Abdomen: soft, non-tender; no masses,  no organomegaly Extremities: extremities normal, atraumatic, no cyanosis or edema Skin: Skin color, texture, turgor normal. No rashes or lesions Lymph nodes: Cervical, supraclavicular, and axillary nodes normal. No abnormal inguinal nodes palpated Neurologic: Grossly normal   Pelvic: External genitalia:  no lesions              Urethra:  normal appearing urethra with no masses, tenderness or lesions              Bartholin's and Skene's: normal                 Vagina: normal appearing vagina with normal color and discharge, no lesions              Cervix: no cervical motion tenderness, no lesions and nulliparous appearance  Pap taken: No. Bimanual Exam:  Uterus:  normal size, contour, position, consistency, mobility, non-tender and anteverted              Adnexa: normal adnexa and no mass, fullness, tenderness               Rectovaginal: Confirms               Anus:  normal sphincter tone, no lesions  Chaperone present: yes  A:  Well Woman with normal exam  Menopausal no HRT  Anxiety with PCP management  Colonoscopy due  P:   Reviewed health and wellness pertinent to exam  Aware of need to advise if vaginal bleeding  Continue follow up with MD as indicated  Discussed risks/benefits of cologard and colonoscopy, will decide and advise which she will do and will schedule.  Questions addressed.  Pap smear: no   counseled on breast self exam, mammography screening, feminine hygiene, adequate intake of calcium and vitamin D, diet and exercise return annually or prn  An After Visit Summary was printed and given to the patient.

## 2019-05-25 ENCOUNTER — Other Ambulatory Visit: Payer: Self-pay | Admitting: Obstetrics & Gynecology

## 2019-05-25 DIAGNOSIS — Z1231 Encounter for screening mammogram for malignant neoplasm of breast: Secondary | ICD-10-CM

## 2019-05-27 ENCOUNTER — Other Ambulatory Visit: Payer: Self-pay

## 2019-05-27 NOTE — Progress Notes (Signed)
58 y.o. G70P1001 Divorced  Caucasian Fe here for annual exam. Has been working on weight loss and now down 49! Taking probiotic, eating pattern better, decrease sugar. Taking Lexapro now with prn Clonazepam with PCP management. Labs here today. Has mammogram scheduled. Has decided to have colonoscopy and desires referral. No other health issues today. Screening labs.  No LMP recorded. Patient has had an ablation.          Sexually active: No.  The current method of family planning is ablation.    Exercising: Yes.    walking Smoker:  no  Review of Systems  Constitutional: Negative.   HENT: Negative.   Eyes: Negative.   Respiratory: Negative.   Cardiovascular: Negative.   Gastrointestinal: Negative.   Genitourinary: Negative.   Musculoskeletal: Negative.   Skin: Negative.   Neurological: Negative.   Endo/Heme/Allergies: Negative.   Psychiatric/Behavioral: Negative.     Health Maintenance: Pap:  05-22-17 negative            07-01-15 negative, HR HPV negative  History of Abnormal Pap: no MMG:  05-23-18 density C/BIRADS 2 benign, scheduled for 09/2019 Self Breast exams: yes Colonoscopy:  never BMD:   never TDaP:  2013 Shingles: no Pneumonia: no Hep C and HIV: during pregnancy  Labs: discuss with provider    reports that she has never smoked. She has never used smokeless tobacco. She reports that she does not drink alcohol or use drugs.  Past Medical History:  Diagnosis Date  . Anxiety   . Plantar fasciitis     Past Surgical History:  Procedure Laterality Date  . ABLATION  02/03/2007  . CERVICAL SPINE SURGERY  2013    Current Outpatient Medications  Medication Sig Dispense Refill  . CLONAZEPAM PO Take by mouth as needed.    . diclofenac (VOLTAREN) 75 MG EC tablet Take 1 tablet (75 mg total) by mouth 2 (two) times daily. 30 tablet 0  . escitalopram (LEXAPRO) 10 MG tablet     . fluticasone (FLONASE) 50 MCG/ACT nasal spray Place 1 spray into both nostrils daily.     .  mometasone (NASONEX) 50 MCG/ACT nasal spray      No current facility-administered medications for this visit.     History reviewed. No pertinent family history.  ROS:  Pertinent items are noted in HPI.  Otherwise, a comprehensive ROS was negative.  Exam:   BP 110/68 (BP Location: Right Arm, Patient Position: Sitting, Cuff Size: Normal)   Pulse 72   Temp (!) 97.3 F (36.3 C) (Temporal)   Resp 14   Ht 5\' 5"  (1.651 m)   Wt 155 lb 4 oz (70.4 kg)   BMI 25.83 kg/m  Height: 5\' 5"  (165.1 cm) Ht Readings from Last 3 Encounters:  05/29/19 5\' 5"  (1.651 m)  05/23/18 5' 5.25" (1.657 m)  05/22/17 5' 5.25" (1.657 m)    General appearance: alert, cooperative and appears stated age Head: Normocephalic, without obvious abnormality, atraumatic Neck: no adenopathy, supple, symmetrical, trachea midline and thyroid normal to inspection and palpation Lungs: clear to auscultation bilaterally Breasts: normal appearance, no masses or tenderness, No nipple retraction or dimpling, No nipple discharge or bleeding, No axillary or supraclavicular adenopathy Heart: regular rate and rhythm Abdomen: soft, non-tender; no masses,  no organomegaly Extremities: extremities normal, atraumatic, no cyanosis or edema Skin: Skin color, texture, turgor normal. No rashes or lesions Lymph nodes: Cervical, supraclavicular, and axillary nodes normal. No abnormal inguinal nodes palpated Neurologic: Grossly normal   Pelvic: External genitalia:  no lesions              Urethra:  normal appearing urethra with no masses, tenderness or lesions              Bartholin's and Skene's: normal                 Vagina: normal appearing vagina with normal color and discharge, no lesions              Cervix: no cervical motion tenderness, no lesions and normal appearance              Pap taken: Yes.   Bimanual Exam:  Uterus:  normal size, contour, position, consistency, mobility, non-tender and anteflexed              Adnexa: normal  adnexa and no mass, fullness, tenderness               Rectovaginal: Confirms               Anus:  normal sphincter tone, no lesions  Chaperone present: yes  A:  Well Woman with normal exam  Menopausal history of ablation  Intentional weight loss 49 pounds  Probiotic use for healthier weight loss  Anxiety with PCP management on Lexapro now  Colonoscopy due  Screening labs  P:   Reviewed health and wellness pertinent to exam  Aware to advise if vaginal bleeding  Congratulated on weight loss and healthy diet, continue to monitor  Continue follow up with PCP as indicated  Discussed risks/benefits request referral . She will be called with information  Labs; Vitamin D, CBC, Lipid panel, CMP, TSH  Pap smear: yes   counseled on breast self exam, mammography screening, feminine hygiene, menopause, adequate intake of calcium and vitamin D, diet and exercise  return annually or prn  An After Visit Summary was printed and given to the patient.

## 2019-05-29 ENCOUNTER — Other Ambulatory Visit: Payer: Self-pay

## 2019-05-29 ENCOUNTER — Ambulatory Visit: Payer: BC Managed Care – PPO | Admitting: Certified Nurse Midwife

## 2019-05-29 ENCOUNTER — Other Ambulatory Visit (HOSPITAL_COMMUNITY)
Admission: RE | Admit: 2019-05-29 | Discharge: 2019-05-29 | Disposition: A | Discharge status: Home / Self Care | Payer: BC Managed Care – PPO | Source: Ambulatory Visit | Attending: Certified Nurse Midwife | Admitting: Certified Nurse Midwife

## 2019-05-29 ENCOUNTER — Encounter: Payer: Self-pay | Admitting: Certified Nurse Midwife

## 2019-05-29 VITALS — BP 110/68 | HR 72 | Temp 97.3°F | Resp 14 | Ht 65.0 in | Wt 155.2 lb

## 2019-05-29 DIAGNOSIS — Z124 Encounter for screening for malignant neoplasm of cervix: Secondary | ICD-10-CM

## 2019-05-29 DIAGNOSIS — F329 Major depressive disorder, single episode, unspecified: Secondary | ICD-10-CM | POA: Insufficient documentation

## 2019-05-29 DIAGNOSIS — Z0000 Encounter for general adult medical examination without abnormal findings: Secondary | ICD-10-CM | POA: Diagnosis not present

## 2019-05-29 DIAGNOSIS — Z1211 Encounter for screening for malignant neoplasm of colon: Secondary | ICD-10-CM

## 2019-05-29 DIAGNOSIS — E559 Vitamin D deficiency, unspecified: Secondary | ICD-10-CM

## 2019-05-29 DIAGNOSIS — R634 Abnormal weight loss: Secondary | ICD-10-CM

## 2019-05-29 DIAGNOSIS — F419 Anxiety disorder, unspecified: Secondary | ICD-10-CM | POA: Insufficient documentation

## 2019-05-29 DIAGNOSIS — F32A Depression, unspecified: Secondary | ICD-10-CM | POA: Insufficient documentation

## 2019-05-29 DIAGNOSIS — M1990 Unspecified osteoarthritis, unspecified site: Secondary | ICD-10-CM | POA: Insufficient documentation

## 2019-05-30 LAB — COMPREHENSIVE METABOLIC PANEL
ALT: 9 IU/L (ref 0–32)
AST: 16 IU/L (ref 0–40)
Albumin/Globulin Ratio: 1.9 (ref 1.2–2.2)
Albumin: 4.3 g/dL (ref 3.8–4.9)
Alkaline Phosphatase: 58 IU/L (ref 39–117)
BUN/Creatinine Ratio: 15 (ref 9–23)
BUN: 13 mg/dL (ref 6–24)
Bilirubin Total: 0.5 mg/dL (ref 0.0–1.2)
CO2: 24 mmol/L (ref 20–29)
Calcium: 9.6 mg/dL (ref 8.7–10.2)
Chloride: 101 mmol/L (ref 96–106)
Creatinine, Ser: 0.85 mg/dL (ref 0.57–1.00)
GFR calc Af Amer: 89 mL/min/{1.73_m2} (ref >59)
GFR calc non Af Amer: 77 mL/min/{1.73_m2} (ref >59)
Globulin, Total: 2.3 g/dL (ref 1.5–4.5)
Glucose: 83 mg/dL (ref 65–99)
Potassium: 4.3 mmol/L (ref 3.5–5.2)
Sodium: 140 mmol/L (ref 134–144)
Total Protein: 6.6 g/dL (ref 6.0–8.5)

## 2019-05-30 LAB — CBC
Hematocrit: 39.4 % (ref 34.0–46.6)
Hemoglobin: 13 g/dL (ref 11.1–15.9)
MCH: 30.2 pg (ref 26.6–33.0)
MCHC: 33 g/dL (ref 31.5–35.7)
MCV: 91 fL (ref 79–97)
Platelets: 222 10*3/uL (ref 150–450)
RBC: 4.31 x10E6/uL (ref 3.77–5.28)
RDW: 13.8 % (ref 11.7–15.4)
WBC: 5.7 10*3/uL (ref 3.4–10.8)

## 2019-05-30 LAB — TSH: TSH: 1.49 u[IU]/mL (ref 0.450–4.500)

## 2019-05-30 LAB — LIPID PANEL
Chol/HDL Ratio: 2.9 ratio (ref 0.0–4.4)
Cholesterol, Total: 188 mg/dL (ref 100–199)
HDL: 64 mg/dL (ref >39)
LDL Calculated: 114 mg/dL — ABNORMAL HIGH (ref 0–99)
Triglycerides: 49 mg/dL (ref 0–149)
VLDL Cholesterol Cal: 10 mg/dL (ref 5–40)

## 2019-05-30 LAB — VITAMIN D 25 HYDROXY (VIT D DEFICIENCY, FRACTURES): Vit D, 25-Hydroxy: 60.6 ng/mL (ref 30.0–100.0)

## 2019-06-02 LAB — CYTOLOGY - PAP
Diagnosis: NEGATIVE
HPV: NOT DETECTED

## 2019-09-23 ENCOUNTER — Ambulatory Visit: Payer: BC Managed Care – PPO

## 2019-11-11 ENCOUNTER — Ambulatory Visit: Payer: BC Managed Care – PPO

## 2019-12-17 ENCOUNTER — Telehealth (INDEPENDENT_AMBULATORY_CARE_PROVIDER_SITE_OTHER): Payer: BC Managed Care – PPO | Admitting: Family Medicine

## 2019-12-17 VITALS — Ht 65.5 in | Wt 156.0 lb

## 2019-12-17 DIAGNOSIS — F411 Generalized anxiety disorder: Secondary | ICD-10-CM

## 2019-12-17 MED ORDER — ESCITALOPRAM OXALATE 10 MG PO TABS: 10.0000 mg | ORAL_TABLET | Freq: Every day | ORAL | 1 refills | Status: DC

## 2019-12-17 NOTE — Progress Notes (Signed)
Established Patient Office Visit  Subjective:  Patient ID: Lindsay Gregory, female    DOB: Dec 06, 1960  Age: 59 y.o. MRN: [id number]  This visit type was conducted due to national recommendations for restrictions regarding the COVID-19 Pandemic (e.g. social distancing) in an effort to limit this patient's exposure and mitigate transmission in our community. This format is felt to be most appropriate for this patient at this time.  The patient did not have access to video technology/had technical difficulties with video requiring transitioning to audio format only (telephone).  All issues noted in this document were discussed and addressed.  No physical exam could be performed with this format.  Patient verbally consented to a telehealth visit.   CC:  Chief Complaint  Patient presents with  . Anxiety    HPI Lindsay Gregory presents for follow up of generalized anxiety. Patient has been very stable on citalopram and clonazepam. Denies depression. 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   Past Medical History:  Diagnosis Date  . Anxiety   . Plantar fasciitis     Past Surgical History:  Procedure Laterality Date  . ABLATION  02/03/2007  . CERVICAL SPINE SURGERY  2013    No family history on file.  Social History   Socioeconomic History  . Marital status: Divorced    Spouse name: Not on file  . Number of children: Not on file  . Years of education: Not on file  . Highest education level: Not on file  Occupational History  . Not on file  Tobacco Use  . Smoking status: Never Smoker  . Smokeless tobacco: Never Used  Substance and Sexual Activity  . Alcohol use: No  . Drug use: No  . Sexual activity: Not Currently    Birth control/protection: Surgical    Comment: ablation  Other Topics Concern  . Not on file  Social History Narrative  . Not on file   Social Determinants of Health   Financial Resource Strain:   . Difficulty of Paying Living  Expenses: Not on file  Food Insecurity:   . Worried About Charity fundraiser in the Last Year: Not on file  . Ran Out of Food in the Last Year: Not on file  Transportation Needs:   . Lack of Transportation (Medical): Not on file  . Lack of Transportation (Non-Medical): Not on file  Physical Activity:   . Days of Exercise per Week: Not on file  . Minutes of Exercise per Session: Not on file  Stress:   . Feeling of Stress : Not on file  Social Connections:   . Frequency of Communication with Friends and Family: Not on file  . Frequency of Social Gatherings with Friends and Family: Not on file  . Attends Religious Services: Not on file  . Active Member of Clubs or Organizations: Not on file  . Attends Archivist Meetings: Not on file  . Marital Status: Not on file  Intimate Partner Violence:   . Fear of Current or Ex-Partner: Not on file  . Emotionally Abused: Not on file  . Physically Abused: Not on file  . Sexually Abused: Not on file    Outpatient Medications Prior to Visit  Medication Sig Dispense Refill  . mometasone (NASONEX) 50 MCG/ACT nasal spray     . CLONAZEPAM PO Take 0.5 mg by mouth daily.    . diclofenac (VOLTAREN) 75 MG EC tablet Take 1 tablet (75 mg total) by  mouth 2 (two) times daily. 30 tablet 0  . escitalopram (LEXAPRO) 10 MG tablet     . fluticasone (FLONASE) 50 MCG/ACT nasal spray Place 1 spray into both nostrils daily.      No facility-administered medications prior to visit.    Allergies  Allergen Reactions  . Amoxicillin Hives and Itching    ROS Review of Systems  Constitutional: Negative for chills, fatigue and fever.  HENT: Negative for congestion, ear pain and sore throat.   Respiratory: Negative for cough and shortness of breath.   Cardiovascular: Negative for chest pain.  Gastrointestinal: Negative for abdominal pain, constipation, diarrhea, nausea and vomiting.  Musculoskeletal: Negative for arthralgias and myalgias.   Neurological: Negative for dizziness and headaches.  Psychiatric/Behavioral: Negative for agitation, decreased concentration, dysphoric mood, sleep disturbance and suicidal ideas. The patient is not nervous/anxious.        Objective:    Physical Exam  Ht 5' 5.5" (1.664 m)   Wt 156 lb (70.8 kg)   BMI 25.56 kg/m  Wt Readings from Last 3 Encounters:  12/17/19 156 lb (70.8 kg)  05/29/19 155 lb 4 oz (70.4 kg)  05/23/18 194 lb (88 kg)   Unable to perform exam. Patient is not in acute distress.  Health Maintenance Due  Topic Date Due  . Hepatitis C Screening  06-06-1961  . HIV Screening  06/20/1977  . COLONOSCOPY  06/20/2012  . INFLUENZA VACCINE  06/13/2019    There are no preventive care reminders to display for this patient.  Lab Results  Component Value Date   TSH 1.490 05/29/2019   Lab Results  Component Value Date   WBC 5.7 05/29/2019   HGB 13.0 05/29/2019   HCT 39.4 05/29/2019   MCV 91 05/29/2019   PLT 222 05/29/2019   Lab Results  Component Value Date   NA 140 05/29/2019   K 4.3 05/29/2019   CO2 24 05/29/2019   GLUCOSE 83 05/29/2019   BUN 13 05/29/2019   CREATININE 0.85 05/29/2019   BILITOT 0.5 05/29/2019   ALKPHOS 58 05/29/2019   AST 16 05/29/2019   ALT 9 05/29/2019   PROT 6.6 05/29/2019   ALBUMIN 4.3 05/29/2019   CALCIUM 9.6 05/29/2019   Lab Results  Component Value Date   CHOL 188 05/29/2019   Lab Results  Component Value Date   HDL 64 05/29/2019   Lab Results  Component Value Date   LDLCALC 114 (H) 05/29/2019   Lab Results  Component Value Date   TRIG 49 05/29/2019   Lab Results  Component Value Date   CHOLHDL 2.9 05/29/2019   No results found for: HGBA1C    Assessment & Plan:   Problem List Items Addressed This Visit      Other   Generalized anxiety disorder - Primary   Relevant Medications   escitalopram (LEXAPRO) 10 MG tablet      Meds ordered this encounter  Medications  . escitalopram (LEXAPRO) 10 MG tablet     Sig: Take 1 tablet (10 mg total) by mouth daily.    Dispense:  90 tablet    Refill:  1  . clonazePAM (KLONOPIN) 0.5 MG tablet    Sig: Take 1 tablet (0.5 mg total) by mouth daily.    Dispense:  30 tablet    Refill:  1   Phone call lasted up to 10 minutes. Follow-up: Return in about 6 months (around 06/15/2020).    Blane Ohara, MD

## 2019-12-20 ENCOUNTER — Encounter: Payer: Self-pay | Admitting: Family Medicine

## 2019-12-20 DIAGNOSIS — F411 Generalized anxiety disorder: Secondary | ICD-10-CM | POA: Insufficient documentation

## 2019-12-20 MED ORDER — CLONAZEPAM 0.5 MG PO TABS: 0.5000 mg | ORAL_TABLET | Freq: Every day | ORAL | 1 refills | Status: DC

## 2019-12-30 ENCOUNTER — Ambulatory Visit
Admission: RE | Admit: 2019-12-30 | Discharge: 2019-12-30 | Disposition: A | Discharge status: Home / Self Care | Payer: BC Managed Care – PPO | Source: Ambulatory Visit | Attending: Obstetrics & Gynecology | Admitting: Obstetrics & Gynecology

## 2019-12-30 ENCOUNTER — Other Ambulatory Visit: Payer: Self-pay

## 2019-12-30 DIAGNOSIS — Z1231 Encounter for screening mammogram for malignant neoplasm of breast: Secondary | ICD-10-CM

## 2020-02-01 ENCOUNTER — Encounter: Payer: Self-pay | Admitting: Certified Nurse Midwife

## 2020-02-16 ENCOUNTER — Other Ambulatory Visit: Payer: Self-pay

## 2020-02-16 MED ORDER — MOMETASONE FUROATE 50 MCG/ACT NA SUSP: 2.0000 | Freq: Every day | NASAL | 2 refills | Status: DC

## 2020-05-22 ENCOUNTER — Other Ambulatory Visit: Payer: Self-pay | Admitting: Physician Assistant

## 2020-05-31 ENCOUNTER — Ambulatory Visit: Payer: BC Managed Care – PPO | Admitting: Certified Nurse Midwife

## 2020-06-17 ENCOUNTER — Other Ambulatory Visit: Payer: Self-pay | Admitting: Family Medicine

## 2020-11-24 ENCOUNTER — Other Ambulatory Visit: Payer: Self-pay

## 2020-11-24 ENCOUNTER — Ambulatory Visit (INDEPENDENT_AMBULATORY_CARE_PROVIDER_SITE_OTHER): Payer: BC Managed Care – PPO | Admitting: Family Medicine

## 2020-11-24 ENCOUNTER — Other Ambulatory Visit: Payer: Self-pay | Admitting: Family Medicine

## 2020-11-24 VITALS — BP 118/74 | HR 70 | Wt 193.0 lb

## 2020-11-24 DIAGNOSIS — F339 Major depressive disorder, recurrent, unspecified: Secondary | ICD-10-CM | POA: Insufficient documentation

## 2020-11-24 DIAGNOSIS — Z1239 Encounter for other screening for malignant neoplasm of breast: Secondary | ICD-10-CM | POA: Diagnosis not present

## 2020-11-24 DIAGNOSIS — F411 Generalized anxiety disorder: Secondary | ICD-10-CM

## 2020-11-24 MED ORDER — ESCITALOPRAM OXALATE 10 MG PO TABS: 10.0000 mg | ORAL_TABLET | Freq: Every day | ORAL | 1 refills | Status: DC

## 2020-11-24 MED ORDER — CLONAZEPAM 0.5 MG PO TABS: 0.5000 mg | ORAL_TABLET | Freq: Every day | ORAL | 1 refills | Status: DC

## 2020-11-24 NOTE — Progress Notes (Signed)
Established Patient Office Visit Medication controlled substance refills Filled  Written  Sold  ID  Drug  QTY  Days  Prescriber  RX #  Dispenser  Refill  Daily Dose*  Pymt Type  PMP    03/24/2020  12/20/2019   1  Clonazepam 0.5 Mg Tablet  30.00  30  Ki Cox  [id number]  [place] (1471)  1/1  1.00 LME  Comm Ins  [place]    12/20/2019  12/20/2019   1  Clonazepam 0.5 Mg Tablet             Subjective:  Patient ID: Lindsay Gregory, female    DOB: Oct 13, 1961  Age: 60 y.o. MRN: [id number]  CC: pt needs refills on medication-lexapro/klonopin  HPI GAELYN TUKES presents for depression-lexapro 10mg -taking daily with good result. Pt states she is doing well-[place]. Son recently got married. Low census at school.   Anxiety-klonopin-prn-pt takes occasionally for anxiety.   Past Medical History:  Diagnosis Date  . Anxiety   . Plantar fasciitis     Past Surgical History:  Procedure Laterality Date  . ABLATION  02/03/2007  . CERVICAL SPINE SURGERY  2013    No family history on file.  Social History   Socioeconomic History  . Marital status: Divorced    Spouse name: Not on file  . Number of children: Not on file  . Years of education: Not on file  . Highest education level: Not on file  Occupational History  . Not on file  Tobacco Use  . Smoking status: Never Smoker  . Smokeless tobacco: Never Used  Substance and Sexual Activity  . Alcohol use: Not on file  . Drug use: Not on file  . Sexual activity: Not Currently    Birth control/protection: Surgical    Comment: ablation  Other Topics Concern  . Not on file  Social History Narrative  . Not on file   Social Determinants of Health   Financial Resource Strain: Not on file  Food Insecurity: Not on file  Transportation Needs: Not on file  Physical Activity: Not on file  Stress: Not on file  Social Connections: Not on file  Intimate Partner Violence: Not on file    Outpatient Medications Prior to  Visit  Medication Sig Dispense Refill  . clonazePAM (KLONOPIN) 0.5 MG tablet Take 1 tablet (0.5 mg total) by mouth daily. 30 tablet 1  . escitalopram (LEXAPRO) 10 MG tablet TAKE 1 TABLET BY MOUTH EVERY DAY 90 tablet 1  . mometasone (NASONEX) 50 MCG/ACT nasal spray PLACE 2 SPRAYS INTO THE NOSE DAILY. 17 g 2   No facility-administered medications prior to visit.    Allergies  Allergen Reactions  . Amoxicillin Hives and Itching    ROS Review of Systems  Constitutional: Negative.   HENT: Negative.   Eyes:       Glasses  Respiratory: Negative.   Cardiovascular: Negative.   Gastrointestinal: Negative.   Endocrine: Negative.   Musculoskeletal: Negative.   Allergic/Immunologic: Negative.   Neurological: Negative.   Hematological: Negative.   Psychiatric/Behavioral: Positive for agitation. The patient is nervous/anxious.       Objective:     Today's Vitals   11/24/20 1633  BP: 118/74  Pulse: 70  SpO2: 98%  Weight: 193 lb (87.5 kg)   Body mass index is 31.63 kg/m. Physical Exam Constitutional:      Appearance: Normal appearance.  HENT:     Head: Normocephalic and atraumatic.  Mouth/Throat:     Mouth: Mucous membranes are moist.  Eyes:     Conjunctiva/sclera: Conjunctivae normal.  Cardiovascular:     Rate and Rhythm: Normal rate.     Pulses: Normal pulses.     Heart sounds: Normal heart sounds.  Musculoskeletal:        General: Normal range of motion.     Cervical back: Normal range of motion and neck supple.  Neurological:     Mental Status: She is alert and oriented to person, place, and time.  Psychiatric:        Mood and Affect: Mood normal.        Behavior: Behavior normal.     Wt Readings from Last 3 Encounters:  12/17/19 156 lb (70.8 kg)  05/29/19 155 lb 4 oz (70.4 kg)  05/23/18 194 lb (88 kg)     Health Maintenance Due  Topic Date Due  . Hepatitis C Screening  Never done  . HIV Screening  Never done  . COLONOSCOPY (Pts 45-3yrs Insurance  coverage will need to be confirmed)  Never done  . INFLUENZA VACCINE  Never done     Lab Results  Component Value Date   TSH 1.490 05/29/2019   Lab Results  Component Value Date   WBC 5.7 05/29/2019   HGB 13.0 05/29/2019   HCT 39.4 05/29/2019   MCV 91 05/29/2019   PLT 222 05/29/2019   Lab Results  Component Value Date   NA 140 05/29/2019   K 4.3 05/29/2019   CO2 24 05/29/2019   GLUCOSE 83 05/29/2019   BUN 13 05/29/2019   CREATININE 0.85 05/29/2019   BILITOT 0.5 05/29/2019   ALKPHOS 58 05/29/2019   AST 16 05/29/2019   ALT 9 05/29/2019   PROT 6.6 05/29/2019   ALBUMIN 4.3 05/29/2019   CALCIUM 9.6 05/29/2019   Lab Results  Component Value Date   CHOL 188 05/29/2019   Lab Results  Component Value Date   HDL 64 05/29/2019   Lab Results  Component Value Date   LDLCALC 114 (H) 05/29/2019   Lab Results  Component Value Date   TRIG 49 05/29/2019   Lab Results  Component Value Date   CHOLHDL 2.9 05/29/2019     Assessment & Plan:  1. Screening breast examination - MM Digital Screening; Future 2. Generalized anxiety disorder Takes klonopin occasionally -uses less than #20 3. Depression, recurrent (HCC) lexapro-rx Follow-up: 6 months Pt to call finsurance for coverage on cologuard and DEXA-will check with insurance. Pap smear utd 2020  Kerianna Rawlinson Mat Carne, MD

## 2020-11-24 NOTE — Patient Instructions (Signed)
[email or web address]">  Depression Screening Depression screening is a tool that your health care provider can use to learn if you have symptoms of depression. Depression is a common condition with many symptoms that are also often found in other conditions. Depression is treatable, but it must first be diagnosed. You may not know that certain feelings, thoughts, and behaviors that you are having can be symptoms of depression. Taking a depression screening test can help you and your health care provider decide if you need more assessment, or if you should be referred to a mental health care provider. What are the screening tests?  You may have a physical exam to see if another condition is affecting your mental health. You may have a blood or urine sample taken during the physical exam.  You may be interviewed using a screening tool that was developed from research, such as one of these: ? Patient Health Questionnaire (PHQ). This is a set of either 2 or 9 questions. A health care provider who has been trained to score this screening test uses a guide to assess if your symptoms suggest that you may have depression. ? Hamilton Depression Rating Scale (HAM-D). This is a set of either 17 or 24 questions. You may be asked to take it again during or after your treatment, to see if your depression has gotten better. ? Beck Depression Inventory (BDI). This is a set of 21 multiple choice questions. Your health care provider scores your answers to assess:  Your level of depression, ranging from mild to severe.  Your response to treatment.  Your health care provider may talk with you about your daily activities, such as eating, sleeping, work, and recreation, and ask if you have had any changes in activity.  Your health care provider may ask you to see a mental health specialist, such as a psychiatrist or psychologist, for more  evaluation. Who should be screened for depression?  All adults, including adults with a family history of a mental health disorder.  Adolescents who are 89-77 years old.  People who are recovering from a myocardial infarction (MI).  Pregnant women, or women who have given birth.  People who have a long-term (chronic) illness.  Anyone who has been diagnosed with another type of a mental health disorder.  Anyone who has symptoms that could show depression.   What do my results mean? Your health care provider will review the results of your depression screening, physical exam, and lab tests. Positive screens suggest that you may have depression. Screening is the first step in getting the care that you may need. It is up to you to get your screening results. Ask your health care provider, or the department that is doing your screening tests, when your results will be ready. Talk with your health care provider about your results and diagnosis. A diagnosis of depression is made using the Diagnostic and Statistical Manual of Mental Disorders (DSM-V). This is a book that lists the number and type of symptoms that must be present for a health care provider to give a specific diagnosis.  Your health care provider may work with you to treat your symptoms of depression, or your health care provider may help you find a mental health provider who can assess, diagnose, and treat your depression. Get help right away if:  You have thoughts about hurting yourself or others. If you ever feel like you may hurt yourself or others, or have thoughts about taking your own life, get help  right away. You can go to your nearest emergency department or call:  Your local emergency services (911 in the [place]  A suicide crisis helpline, such as the National Suicide Prevention Lifeline at 912 822 9007. This is open 24 hours a day. Summary  Depression screening is the first step in getting the help that you may  need.  If your screening test shows symptoms of depression (is positive), your health care provider may ask you to see a mental health provider.  Anyone who is age 110 or older should be screened for depression. This information is not intended to replace advice given to you by your health care provider. Make sure you discuss any questions you have with your health care provider. Document Revised: 04/21/2020 Document Reviewed: 04/21/2020 Elsevier Patient Education  2021 Elsevier Inc. http://NIMH.NIH.Gov">  Generalized Anxiety Disorder, Adult Generalized anxiety disorder (GAD) is a mental health condition. Unlike normal worries, anxiety related to GAD is not triggered by a specific event. These worries do not fade or get better with time. GAD interferes with relationships, work, and school. GAD symptoms can vary from mild to severe. People with severe GAD can have intense waves of anxiety with physical symptoms that are similar to panic attacks. What are the causes? The exact cause of GAD is not known, but the following are believed to have an impact:  Differences in natural brain chemicals.  Genes passed down from parents to children.  Differences in the way threats are perceived.  Development during childhood.  Personality. What increases the risk? The following factors may make you more likely to develop this condition:  Being female.  Having a family history of anxiety disorders.  Being very shy.  Experiencing very stressful life events, such as the death of a loved one.  Having a very stressful family environment. What are the signs or symptoms? People with GAD often worry excessively about many things in their lives, such as their health and family. Symptoms may also include:  Mental and emotional symptoms: ? Worrying excessively about natural disasters. ? Fear of being late. ? Difficulty concentrating. ? Fears that others are judging your performance.  Physical  symptoms: ? Fatigue. ? Headaches, muscle tension, muscle twitches, trembling, or feeling shaky. ? Feeling like your heart is pounding or beating very fast. ? Feeling out of breath or like you cannot take a deep breath. ? Having trouble falling asleep or staying asleep, or experiencing restlessness. ? Sweating. ? Nausea, diarrhea, or irritable bowel syndrome (IBS).  Behavioral symptoms: ? Experiencing erratic moods or irritability. ? Avoidance of new situations. ? Avoidance of people. ? Extreme difficulty making decisions. How is this diagnosed? This condition is diagnosed based on your symptoms and medical history. You will also have a physical exam. Your health care provider may perform tests to rule out other possible causes of your symptoms. To be diagnosed with GAD, a person must have anxiety that:  Is out of his or her control.  Affects several different aspects of his or her life, such as work and relationships.  Causes distress that makes him or her unable to take part in normal activities.  Includes at least three symptoms of GAD, such as restlessness, fatigue, trouble concentrating, irritability, muscle tension, or sleep problems. Before your health care provider can confirm a diagnosis of GAD, these symptoms must be present more days than they are not, and they must last for 6 months or longer. How is this treated? This condition may be treated with:  Medicine. Antidepressant medicine is usually prescribed for long-term daily control. Anti-anxiety medicines may be added in severe cases, especially when panic attacks occur.  Talk therapy (psychotherapy). Certain types of talk therapy can be helpful in treating GAD by providing support, education, and guidance. Options include: ? Cognitive behavioral therapy (CBT). People learn coping skills and self-calming techniques to ease their physical symptoms. They learn to identify unrealistic thoughts and behaviors and to replace  them with more appropriate thoughts and behaviors. ? Acceptance and commitment therapy (ACT). This treatment teaches people how to be mindful as a way to cope with unwanted thoughts and feelings. ? Biofeedback. This process trains you to manage your body's response (physiological response) through breathing techniques and relaxation methods. You will work with a therapist while machines are used to monitor your physical symptoms.  Stress management techniques. These include yoga, meditation, and exercise. A mental health specialist can help determine which treatment is best for you. Some people see improvement with one type of therapy. However, other people require a combination of therapies.   Follow these instructions at home: Lifestyle  Maintain a consistent routine and schedule.  Anticipate stressful situations. Create a plan, and allow extra time to work with your plan.  Practice stress management or self-calming techniques that you have learned from your therapist or your health care provider. General instructions  Take over-the-counter and prescription medicines only as told by your health care provider.  Understand that you are likely to have setbacks. Accept this and be kind to yourself as you persist to take better care of yourself.  Recognize and accept your accomplishments, even if you judge them as small.  Keep all follow-up visits as told by your health care provider. This is important. Contact a health care provider if:  Your symptoms do not get better.  Your symptoms get worse.  You have signs of depression, such as: ? A persistently sad or irritable mood. ? Loss of enjoyment in activities that used to bring you joy. ? Change in weight or eating. ? Changes in sleeping habits. ? Avoiding friends or family members. ? Loss of energy for normal tasks. ? Feelings of guilt or worthlessness. Get help right away if:  You have serious thoughts about hurting yourself or  others. If you ever feel like you may hurt yourself or others, or have thoughts about taking your own life, get help right away. Go to your nearest emergency department or:  Call your local emergency services (911 in the [place]  Call a suicide crisis helpline, such as the National Suicide Prevention Lifeline at 249-869-1534. This is open 24 hours a day in the [place]  Text the Crisis Text Line at (651)641-5823 (in the [place] Summary  Generalized anxiety disorder (GAD) is a mental health condition that involves worry that is not triggered by a specific event.  People with GAD often worry excessively about many things in their lives, such as their health and family.  GAD may cause symptoms such as restlessness, trouble concentrating, sleep problems, frequent sweating, nausea, diarrhea, headaches, and trembling or muscle twitching.  A mental health specialist can help determine which treatment is best for you. Some people see improvement with one type of therapy. However, other people require a combination of therapies. This information is not intended to replace advice given to you by your health care provider. Make sure you discuss any questions you have with your health care provider. Document Revised: 08/19/2019 Document Reviewed: 08/19/2019 Elsevier Patient Education  2021  [employer].

## 2021-05-30 ENCOUNTER — Other Ambulatory Visit: Payer: Self-pay

## 2021-05-30 ENCOUNTER — Ambulatory Visit (INDEPENDENT_AMBULATORY_CARE_PROVIDER_SITE_OTHER): Payer: BC Managed Care – PPO

## 2021-05-30 ENCOUNTER — Encounter: Payer: Self-pay | Admitting: Family Medicine

## 2021-05-30 ENCOUNTER — Ambulatory Visit (INDEPENDENT_AMBULATORY_CARE_PROVIDER_SITE_OTHER): Payer: BC Managed Care – PPO | Admitting: Family Medicine

## 2021-05-30 VITALS — BP 110/80 | HR 77 | Temp 96.1°F | Ht 64.5 in | Wt 205.0 lb

## 2021-05-30 DIAGNOSIS — R0683 Snoring: Secondary | ICD-10-CM

## 2021-05-30 DIAGNOSIS — F330 Major depressive disorder, recurrent, mild: Secondary | ICD-10-CM

## 2021-05-30 DIAGNOSIS — E66811 Obesity, class 1: Secondary | ICD-10-CM

## 2021-05-30 DIAGNOSIS — Z6834 Body mass index (BMI) 34.0-34.9, adult: Secondary | ICD-10-CM

## 2021-05-30 DIAGNOSIS — Z0000 Encounter for general adult medical examination without abnormal findings: Secondary | ICD-10-CM

## 2021-05-30 DIAGNOSIS — R0681 Apnea, not elsewhere classified: Secondary | ICD-10-CM

## 2021-05-30 DIAGNOSIS — Z1211 Encounter for screening for malignant neoplasm of colon: Secondary | ICD-10-CM

## 2021-05-30 DIAGNOSIS — Z23 Encounter for immunization: Secondary | ICD-10-CM | POA: Diagnosis not present

## 2021-05-30 DIAGNOSIS — Z1231 Encounter for screening mammogram for malignant neoplasm of breast: Secondary | ICD-10-CM

## 2021-05-30 DIAGNOSIS — E6609 Other obesity due to excess calories: Secondary | ICD-10-CM

## 2021-05-30 MED ORDER — CLONAZEPAM 0.5 MG PO TABS: 0.5000 mg | ORAL_TABLET | Freq: Every day | ORAL | 1 refills | Status: DC

## 2021-05-30 MED ORDER — ESCITALOPRAM OXALATE 10 MG PO TABS: 10.0000 mg | ORAL_TABLET | Freq: Every day | ORAL | 3 refills | Status: DC

## 2021-05-30 MED ORDER — MOMETASONE FUROATE 50 MCG/ACT NA SUSP: 2.0000 | Freq: Every day | NASAL | 2 refills | Status: DC

## 2021-05-30 NOTE — Patient Instructions (Addendum)
Phentermine (adipex) Qsymia (phentermine/topiramate) Reginal Lutes  Contrave

## 2021-05-30 NOTE — Progress Notes (Signed)
Subjective:  Patient ID: Lindsay Gregory, female    DOB: 1960-12-03  Age: 60 y.o. MRN: [id number]  Chief Complaint  Patient presents with   CPE    HPI Well Adult Physical: Patient here for a comprehensive physical exam.The patient reports no problems Do you take any herbs or supplements that were not prescribed by a doctor? no Are you taking calcium supplements? no Are you taking aspirin daily? no  Encounter for general adult medical examination without abnormal findings  Physical ("At Risk" items are starred): Patient's last physical exam was 1 year ago .  Smoking: Life-long non-smoker ;  Physical Activity: Exercises at least 3 times per week ; works on her farm.  Alcohol/Drug Use: Is a non-drinker ; No illicit drug use ;  Patient is not afflicted from Stress Incontinence and Urge Incontinence  Safety: reviewed. Patient wears a seat belt, has smoke detectors, has carbon monoxide detectors, practices appropriate gun safety, and wears sunscreen with extended sun exposure. Dental Care: biannual cleanings, brushes and flosses daily. Ophthalmology/Optometry: Annual visit.  Hearing loss: none Vision impairments: none  Menarche: 60 y.o Menstrual History: Regular LMP: S/P Menopause Pregnancy history: 1 live birth Safe at home: Yes Self breast exams: Yes.  Mammogram 12/30/2019 DUE  Flowsheet Row Office Visit from 05/30/2021 in [facility]  PHQ-2 Total Score 2       GAD 7 : Generalized Anxiety Score 05/30/2021 11/24/2020  Nervous, Anxious, on Edge 0 0  Control/stop worrying 1 1  Worry too much - different things 1 0  Trouble relaxing 1 0  Restless 0 0  Easily annoyed or irritable 0 0  Afraid - awful might happen 0 0  Total GAD 7 Score 3 1  Anxiety Difficulty Not difficult at all Somewhat difficult      Social Hx   Social History   Socioeconomic History   Marital status: Divorced    Spouse name: Not on file   Number of children: Not on file   Years of  education: Not on file   Highest education level: Not on file  Occupational History   Not on file  Tobacco Use   Smoking status: Never   Smokeless tobacco: Never  Substance and Sexual Activity   Alcohol use: Not on file   Drug use: Not on file   Sexual activity: Not Currently    Birth control/protection: Surgical    Comment: ablation  Other Topics Concern   Not on file  Social History Narrative   Not on file   Social Determinants of Health   Financial Resource Strain: Not on file  Food Insecurity: Not on file  Transportation Needs: Not on file  Physical Activity: Not on file  Stress: Not on file  Social Connections: Not on file   Past Medical History:  Diagnosis Date   Anxiety    Plantar fasciitis    Past Surgical History:  Procedure Laterality Date   ABLATION  02/03/2007   CERVICAL SPINE SURGERY  2013    History reviewed. No pertinent family history.  Review of Systems  Constitutional:  Negative for chills, fatigue and fever.  HENT:  Negative for congestion, ear pain, rhinorrhea and sore throat.   Respiratory:  Negative for cough and shortness of breath.   Cardiovascular:  Negative for chest pain.  Gastrointestinal:  Negative for abdominal pain, constipation, diarrhea, nausea and vomiting.  Genitourinary:  Negative for dysuria and urgency.  Musculoskeletal:  Negative for back pain and myalgias.  Neurological:  Negative for dizziness, weakness, light-headedness and headaches.  Psychiatric/Behavioral:  Negative for dysphoric mood. The patient is not nervous/anxious.     Objective:  BP 110/80   Pulse 77   Temp (!) 96.1 F (35.6 C)   Ht 5' 4.5" (1.638 m)   Wt 205 lb (93 kg)   SpO2 95%   BMI 34.64 kg/m   BP/Weight 05/30/2021 11/24/2020 12/17/2019  Systolic BP 110 118 -  Diastolic BP 80 74 -  Wt. (Lbs) 205 193 156  BMI 34.64 31.63 25.56    Physical Exam Vitals reviewed.  Constitutional:      Appearance: Normal appearance.  HENT:     Right Ear: Tympanic  membrane normal.     Left Ear: Tympanic membrane normal.     Nose: Nose normal.     Mouth/Throat:     Pharynx: No oropharyngeal exudate or posterior oropharyngeal erythema.  Eyes:     Conjunctiva/sclera: Conjunctivae normal.  Neck:     Vascular: No carotid bruit.  Cardiovascular:     Rate and Rhythm: Normal rate and regular rhythm.     Pulses: Normal pulses.     Heart sounds: Normal heart sounds.  Pulmonary:     Effort: Pulmonary effort is normal.     Breath sounds: Normal breath sounds.  Abdominal:     General: Bowel sounds are normal.     Palpations: There is no mass.     Tenderness: There is no abdominal tenderness.  Musculoskeletal:     Cervical back: Normal range of motion.  Skin:    Findings: Lesion (scaly lesion n nose. s) present.  Neurological:     Mental Status: She is alert and oriented to person, place, and time.  Psychiatric:        Mood and Affect: Mood normal.        Behavior: Behavior normal.    Lab Results  Component Value Date   WBC 5.8 05/30/2021   HGB 13.0 05/30/2021   HCT 39.5 05/30/2021   PLT 259 05/30/2021   GLUCOSE 100 (H) 05/30/2021   CHOL 208 (H) 05/30/2021   TRIG 104 05/30/2021   HDL 60 05/30/2021   LDLCALC 130 (H) 05/30/2021   ALT 13 05/30/2021   AST 16 05/30/2021   NA 140 05/30/2021   K 5.0 05/30/2021   CL 102 05/30/2021   CREATININE 1.12 (H) 05/30/2021   BUN 15 05/30/2021   CO2 23 05/30/2021   TSH 1.480 05/30/2021      Assessment & Plan:  1. Routine medical exam Recommend continue to work on eating healthy diet and exercise. Recommend weight loss.  Recommend work on [employer].  - CBC with Differential/Platelet - Comprehensive metabolic panel - Lipid panel - TSH - Cardiovascular Risk Assessment  2. Encounter for screening mammogram for malignant neoplasm of breast - MM DIGITAL SCREENING BILATERAL  3. Apnea - Home sleep test  4. Snoring - Home sleep test  5. Class 1 obesity due to excess calories without  serious comorbidity with body mass index (BMI) of 34.0 to 34.9 in adult Recommend continue to work on eating healthy diet and exercise. May restart optavia as she still has product at home and she did well previously on this diet.  Consider alternatives. List of weight loss medicines given to research.   6. Depression, major, recurrent, mild (HCC) The current medical regimen is effective;  continue present plan and medications. - clonazePAM (KLONOPIN) 0.5 MG tablet; Take 1 tablet (0.5 mg total) by mouth daily.  Dispense: 30 tablet; Refill: 1 - escitalopram (LEXAPRO) 10 MG tablet; Take 1 tablet (10 mg total) by mouth daily.  Dispense: 90 tablet; Refill: 3  7. Screen for colon cancer - Ambulatory referral to Gastroenterology   Body mass index is 34.64 kg/m.   This is a list of the screening recommended for you and due dates:  Health Maintenance  Topic Date Due   HIV Screening  Never done   Hepatitis C Screening: USPSTF Recommendation to screen - Ages 30-79 yo.  Never done   Colon Cancer Screening  Never done   Zoster (Shingles) Vaccine (1 of 2) Never done   Flu Shot  06/12/2021   Tetanus Vaccine  11/12/2021   Mammogram  12/29/2021   Pap Smear  05/28/2022   COVID-19 Vaccine  Completed   Pneumococcal Vaccination  Aged Out   HPV Vaccine  Aged Out     AN INDIVIDUALIZED CARE PLAN: was established or reinforced today.   SELF MANAGEMENT: The patient and I together assessed ways to personally work towards obtaining the recommended goals  Support needs The patient and/or family needs were assessed and services were offered if appropriate.  Meds ordered this encounter  Medications   clonazePAM (KLONOPIN) 0.5 MG tablet    Sig: Take 1 tablet (0.5 mg total) by mouth daily.    Dispense:  30 tablet    Refill:  1   mometasone (NASONEX) 50 MCG/ACT nasal spray    Sig: Place 2 sprays into the nose daily.    Dispense:  17 g    Refill:  2   escitalopram (LEXAPRO) 10 MG tablet    Sig: Take 1  tablet (10 mg total) by mouth daily.    Dispense:  90 tablet    Refill:  3    Follow-up: Return in about 1 month (around 06/30/2021) for weight mgmt/skin procedure. .  An After Visit Summary was printed and given to the patient.  Blane Ohara, MD [facility] [phone]

## 2021-05-31 LAB — CBC WITH DIFFERENTIAL/PLATELET
Basophils Absolute: 0.1 10*3/uL (ref 0.0–0.2)
Basos: 1 %
EOS (ABSOLUTE): 0.2 10*3/uL (ref 0.0–0.4)
Eos: 3 %
Hematocrit: 39.5 % (ref 34.0–46.6)
Hemoglobin: 13 g/dL (ref 11.1–15.9)
Immature Grans (Abs): 0 10*3/uL (ref 0.0–0.1)
Immature Granulocytes: 0 %
Lymphocytes Absolute: 1.7 10*3/uL (ref 0.7–3.1)
Lymphs: 30 %
MCH: 28.3 pg (ref 26.6–33.0)
MCHC: 32.9 g/dL (ref 31.5–35.7)
MCV: 86 fL (ref 79–97)
Monocytes Absolute: 0.6 10*3/uL (ref 0.1–0.9)
Monocytes: 10 %
Neutrophils Absolute: 3.3 10*3/uL (ref 1.4–7.0)
Neutrophils: 56 %
Platelets: 259 10*3/uL (ref 150–450)
RBC: 4.59 x10E6/uL (ref 3.77–5.28)
RDW: 13.5 % (ref 11.7–15.4)
WBC: 5.8 10*3/uL (ref 3.4–10.8)

## 2021-05-31 LAB — LIPID PANEL
Chol/HDL Ratio: 3.5 ratio (ref 0.0–4.4)
Cholesterol, Total: 208 mg/dL — ABNORMAL HIGH (ref 100–199)
HDL: 60 mg/dL (ref >39)
LDL Chol Calc (NIH): 130 mg/dL — ABNORMAL HIGH (ref 0–99)
Triglycerides: 104 mg/dL (ref 0–149)
VLDL Cholesterol Cal: 18 mg/dL (ref 5–40)

## 2021-05-31 LAB — COMPREHENSIVE METABOLIC PANEL
ALT: 13 IU/L (ref 0–32)
AST: 16 IU/L (ref 0–40)
Albumin/Globulin Ratio: 2.1 (ref 1.2–2.2)
Albumin: 4.6 g/dL (ref 3.8–4.9)
Alkaline Phosphatase: 82 IU/L (ref 44–121)
BUN/Creatinine Ratio: 13 (ref 9–23)
BUN: 15 mg/dL (ref 6–24)
Bilirubin Total: 0.6 mg/dL (ref 0.0–1.2)
CO2: 23 mmol/L (ref 20–29)
Calcium: 9.8 mg/dL (ref 8.7–10.2)
Chloride: 102 mmol/L (ref 96–106)
Creatinine, Ser: 1.12 mg/dL — ABNORMAL HIGH (ref 0.57–1.00)
Globulin, Total: 2.2 g/dL (ref 1.5–4.5)
Glucose: 100 mg/dL — ABNORMAL HIGH (ref 65–99)
Potassium: 5 mmol/L (ref 3.5–5.2)
Sodium: 140 mmol/L (ref 134–144)
Total Protein: 6.8 g/dL (ref 6.0–8.5)
eGFR: 57 mL/min/{1.73_m2} — ABNORMAL LOW (ref >59)

## 2021-05-31 LAB — TSH: TSH: 1.48 u[IU]/mL (ref 0.450–4.500)

## 2021-05-31 LAB — CARDIOVASCULAR RISK ASSESSMENT

## 2021-06-20 ENCOUNTER — Ambulatory Visit: Payer: BC Managed Care – PPO | Admitting: Nurse Practitioner

## 2021-07-02 IMAGING — MG DIGITAL SCREENING BILAT W/ TOMO W/ CAD
8 series · 9 of 24 positions shown · non-contrast
Comparison: Previous exam(s).

CLINICAL DATA: Screening.

EXAM:
DIGITAL SCREENING BILATERAL MAMMOGRAM WITH TOMO AND CAD

[R CC synth-2D]
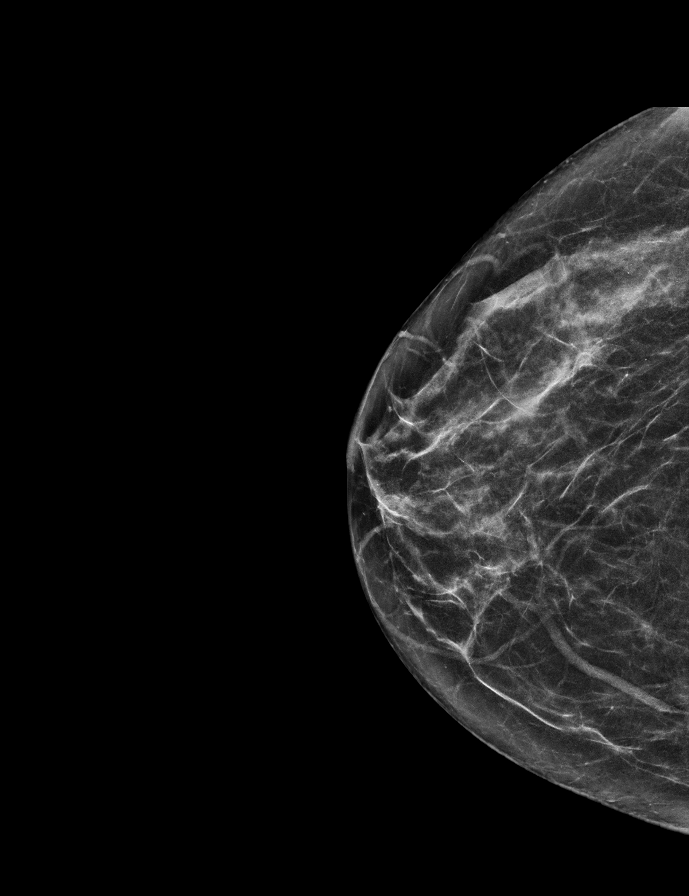

[L MLO synth-2D]
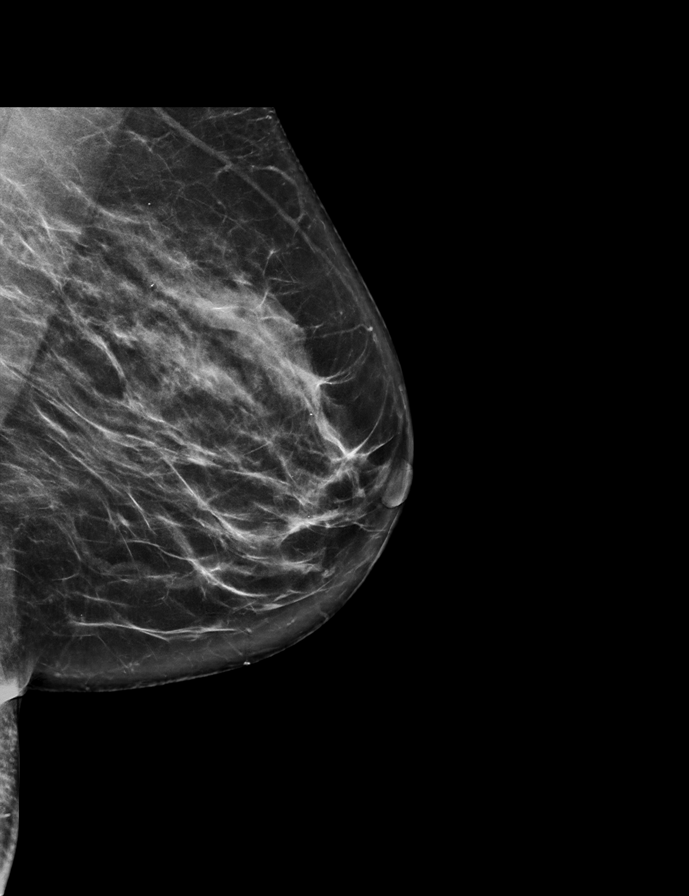

[R MLO synth-2D]
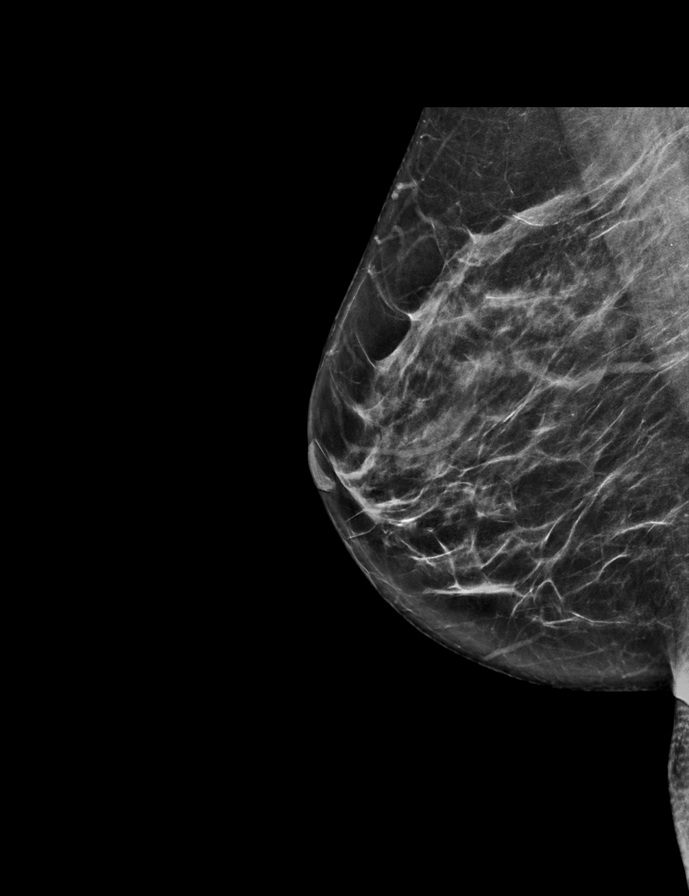

[L CC synth-2D]
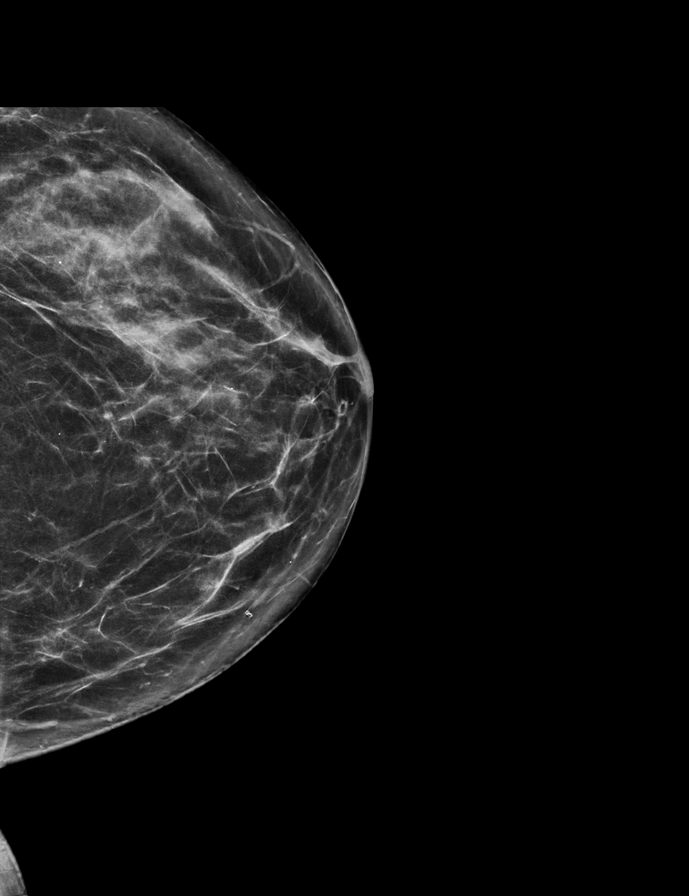

[L MLO tomo · 2 of 69 frames shown]
[frame 23/69]
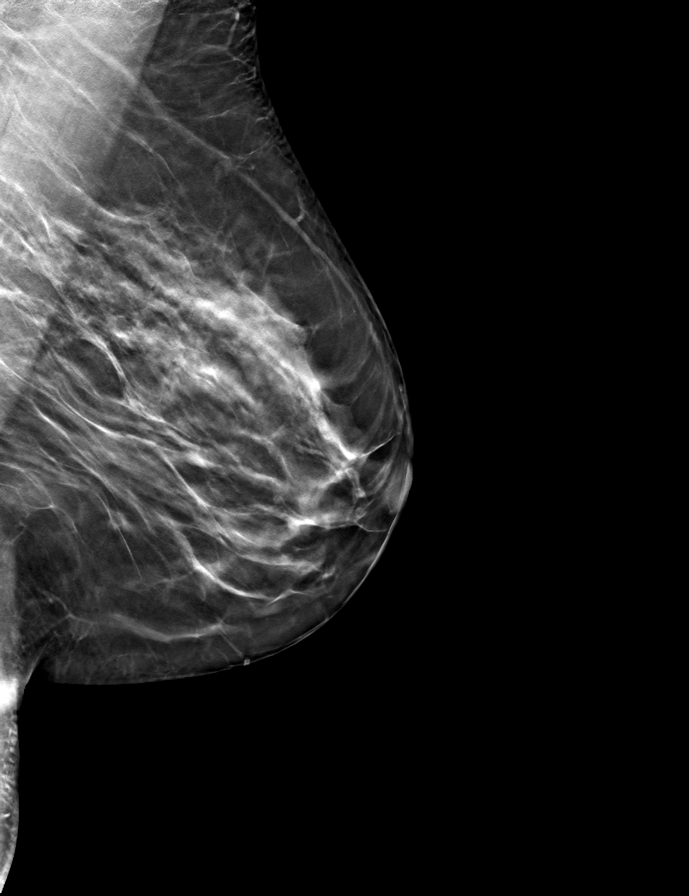
[frame 35/69]
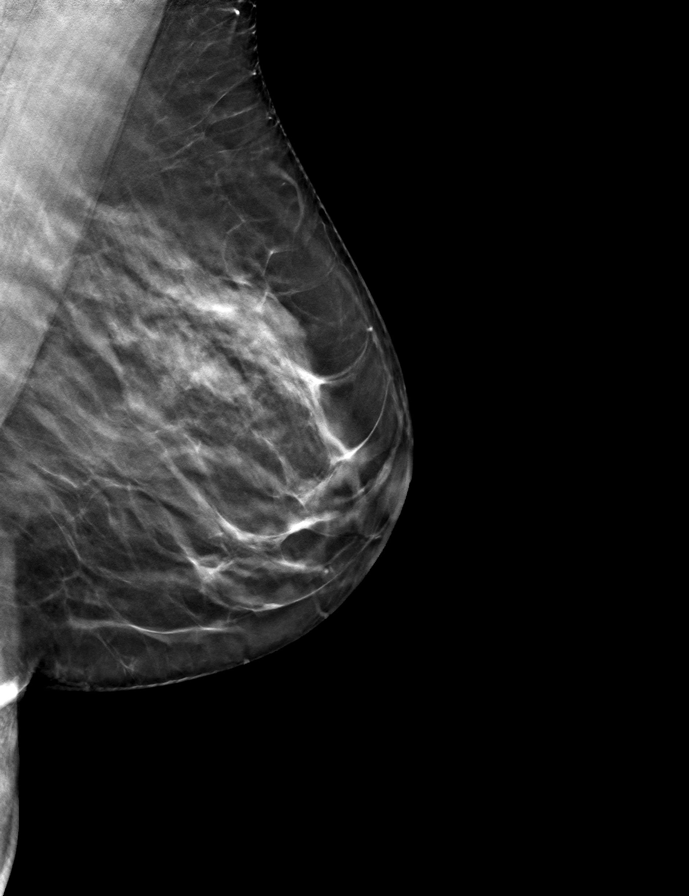

[L CC tomo · tomo slice 31/62.0]
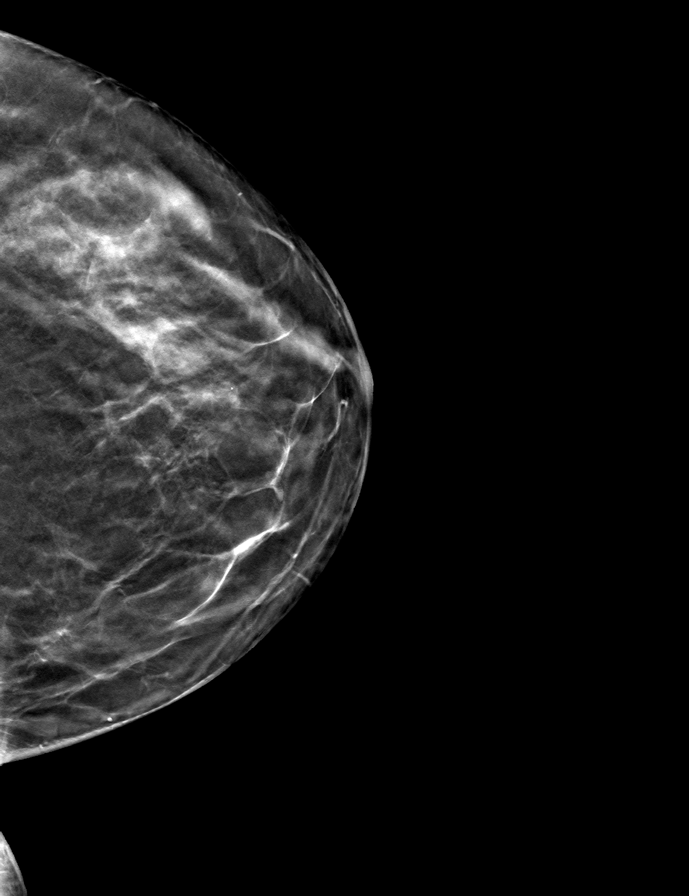

[R MLO tomo · tomo slice 33/64.0]
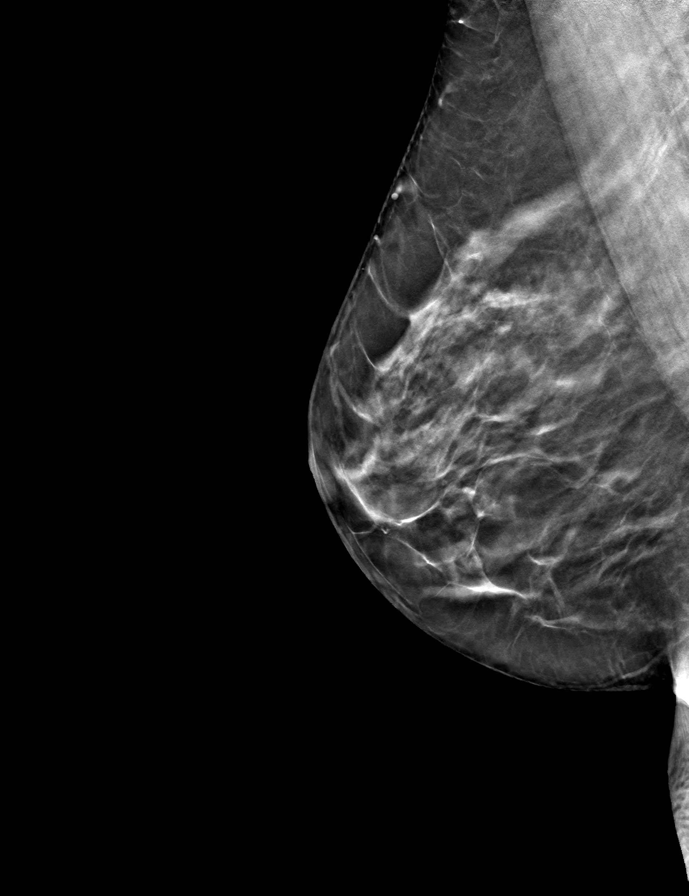

[R CC tomo · tomo slice 29/56.0]
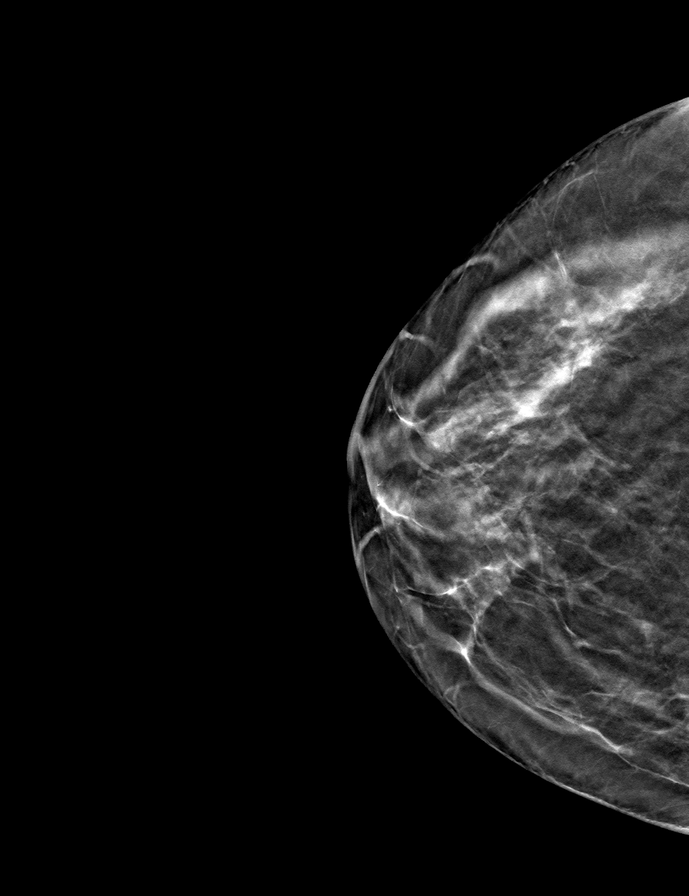

[9 of 24 positions shown; findings below may reference images not displayed]

ACR Breast Density Category c: The breast tissue is heterogeneously
dense, which may obscure small masses.
FINDINGS: There are no findings suspicious for malignancy. Images were
processed with CAD.
IMPRESSION: No mammographic evidence of malignancy. A result letter of this
screening mammogram will be mailed directly to the patient.

RECOMMENDATION:
Screening mammogram in one year. (Code:FT-U-LHB)

BI-RADS CATEGORY  1: Negative.

## 2021-08-15 ENCOUNTER — Ambulatory Visit: Payer: Self-pay | Admitting: Nurse Practitioner

## 2021-08-15 DIAGNOSIS — Z0289 Encounter for other administrative examinations: Secondary | ICD-10-CM

## 2021-08-15 NOTE — Progress Notes (Deleted)
   Lindsay Gregory June 01, 1961 [id number]   History:  60 y.o. G1P1001 presents for annual exam. Postmenopausal - no HRT, no bleeding. Normal pap history. Anxiety/depression managed by PCP.   Gynecologic History No LMP recorded. Patient has had an ablation.   Contraception/Family planning: post menopausal status Sexually active: ***  Health Maintenance Last Pap: 05/29/2019. Results were: Normal, 5-year repeat Last mammogram: 12/30/2019. Results were: Normal Last colonoscopy: Never  Last Dexa: Not indicated  Past medical history, past surgical history, family history and social history were all reviewed and documented in the EPIC chart.  ROS:  A ROS was performed and pertinent positives and negatives are included.  Exam:  There were no vitals filed for this visit. There is no height or weight on file to calculate BMI.  General appearance:  Normal Thyroid:  Symmetrical, normal in size, without palpable masses or nodularity. Respiratory  Auscultation:  Clear without wheezing or rhonchi Cardiovascular  Auscultation:  Regular rate, without rubs, murmurs or gallops  Edema/varicosities:  Not grossly evident Abdominal  Soft,nontender, without masses, guarding or rebound.  Liver/spleen:  No organomegaly noted  Hernia:  None appreciated  Skin  Inspection:  Grossly normal Breasts: Examined lying and sitting.   Right: Without masses, retractions, nipple discharge or axillary adenopathy.   Left: Without masses, retractions, nipple discharge or axillary adenopathy. Genitourinary   Inguinal/mons:  Normal without inguinal adenopathy  External genitalia:  Normal appearing vulva with no masses, tenderness, or lesions  BUS/Urethra/Skene's glands:  Normal  Vagina:  Normal appearing with normal color and discharge, no lesions  Cervix:  Normal appearing without discharge or lesions  Uterus:  Normal in size, shape and contour.  Midline and mobile, nontender  Adnexa/parametria:      Rt: Normal in size, without masses or tenderness.   Lt: Normal in size, without masses or tenderness.  Anus and perineum: Normal  Digital rectal exam: Normal sphincter tone without palpated masses or tenderness  Patient informed chaperone available to be present for breast and pelvic exam. Patient has requested no chaperone to be present. Patient has been advised what will be completed during breast and pelvic exam.   Assessment/Plan:  60 y.o. G1P1001 for annual exam.    Return in 1 year for annual.   Olivia Mackie DNP, 8:17 AM 59/02/2021

## 2021-09-04 ENCOUNTER — Encounter: Payer: Self-pay | Admitting: Nurse Practitioner

## 2021-09-04 ENCOUNTER — Telehealth (INDEPENDENT_AMBULATORY_CARE_PROVIDER_SITE_OTHER): Payer: BC Managed Care – PPO | Admitting: Nurse Practitioner

## 2021-09-04 DIAGNOSIS — R051 Acute cough: Secondary | ICD-10-CM | POA: Diagnosis not present

## 2021-09-04 DIAGNOSIS — R0602 Shortness of breath: Secondary | ICD-10-CM

## 2021-09-04 DIAGNOSIS — J101 Influenza due to other identified influenza virus with other respiratory manifestations: Secondary | ICD-10-CM

## 2021-09-04 DIAGNOSIS — U071 COVID-19: Secondary | ICD-10-CM

## 2021-09-04 DIAGNOSIS — R0981 Nasal congestion: Secondary | ICD-10-CM | POA: Diagnosis not present

## 2021-09-04 MED ORDER — PROMETHAZINE-DM 6.25-15 MG/5ML PO SYRP: 5.0000 mL | ORAL_SOLUTION | Freq: Four times a day (QID) | ORAL | 0 refills | Status: DC | PRN

## 2021-09-04 MED ORDER — ALBUTEROL SULFATE HFA 108 (90 BASE) MCG/ACT IN AERS
2.0000 | INHALATION_SPRAY | Freq: Four times a day (QID) | RESPIRATORY_TRACT | 0 refills | Status: DC | PRN
Start: 2021-09-04 — End: 2022-06-14

## 2021-09-04 NOTE — Progress Notes (Signed)
Virtual Visit via Video Note   This visit type was conducted due to national recommendations for restrictions regarding the COVID-19 Pandemic (e.g. social distancing) in an effort to limit this patient's exposure and mitigate transmission in our community.  Due to her co-morbid illnesses, this patient is at least at moderate risk for complications without adequate follow up.  This format is felt to be most appropriate for this patient at this time.  All issues noted in this document were discussed and addressed.  A limited physical exam was performed with this format.  A verbal consent was obtained for the virtual visit.   Date:  09/04/2021   ID:  Lindsay Gregory, DOB 05-05-1961, MRN [id number]  Patient Location: Home Provider Location: Office/Clinic  PCP:  Blane Ohara, MD   Evaluation Performed:  Established patient, acute telemedicine visit  Chief Complaint:  cough  History of Present Illness:    Lindsay Gregory is a 60 y.o. female with productive cough, hoarseness, dyspnea with activity, sinus congestion, and post nasal drip. Onset of symptoms was two weeks ago. Lindsay Gregory has a past medical history of seasonal allergic rhinitis that she treats with Nasonex nasal spray.Home COVID-19 test negative two weeks ago. She denies fever. 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 She has obtained COVID-19 vaccines and boosters.   The patient does have symptoms concerning for COVID-19 infection (fever, chills, cough, or new shortness of breath).    Past Medical History:  Diagnosis Date   Anxiety    Plantar fasciitis     Past Surgical History:  Procedure Laterality Date   ABLATION  02/03/2007   CERVICAL SPINE SURGERY  2013    No family history on file.  Social History   Socioeconomic History   Marital status: Divorced    Spouse name: Not on file   Number of children: Not on file   Years of education: Not on file   Highest education level: Not  on file  Occupational History   Not on file  Tobacco Use   Smoking status: Never   Smokeless tobacco: Never  Substance and Sexual Activity   Alcohol use: Not on file   Drug use: Not on file   Sexual activity: Not Currently    Birth control/protection: Surgical    Comment: ablation  Other Topics Concern   Not on file  Social History Narrative   Not on file   Social Determinants of Health   Financial Resource Strain: Not on file  Food Insecurity: Not on file  Transportation Needs: Not on file  Physical Activity: Not on file  Stress: Not on file  Social Connections: Not on file  Intimate Partner Violence: Not on file    Outpatient Medications Prior to Visit  Medication Sig Dispense Refill   clonazePAM (KLONOPIN) 0.5 MG tablet Take 1 tablet (0.5 mg total) by mouth daily. 30 tablet 1   escitalopram (LEXAPRO) 10 MG tablet Take 1 tablet (10 mg total) by mouth daily. 90 tablet 3   mometasone (NASONEX) 50 MCG/ACT nasal spray Place 2 sprays into the nose daily. 17 g 2   No facility-administered medications prior to visit.    Allergies:   Amoxicillin   Social History   Tobacco Use   Smoking status: Never   Smokeless tobacco: Never     Review of Systems  Constitutional:  Positive for malaise/fatigue. Negative for fever.  HENT:  Positive for congestion, sinus pain and sore throat.  Post-nasal-drip, hoarseness  Respiratory:  Positive for cough, sputum production and shortness of breath (with activity).   Cardiovascular: Negative.   Gastrointestinal: Negative.   Genitourinary: Negative.   Musculoskeletal:  Positive for myalgias (generalized body aches).  Skin:  Negative for rash.  Neurological:  Positive for headaches.  Endo/Heme/Allergies:  Positive for environmental allergies.  Psychiatric/Behavioral: Negative.      Labs/Other Tests and Data Reviewed:    Recent Labs: 05/30/2021: ALT 13; BUN 15; Creatinine, Ser 1.12; Hemoglobin 13.0; Platelets 259; Potassium 5.0;  Sodium 140; TSH 1.480   Recent Lipid Panel Lab Results  Component Value Date/Time   CHOL 208 (H) 05/30/2021 09:31 AM   TRIG 104 05/30/2021 09:31 AM   HDL 60 05/30/2021 09:31 AM   CHOLHDL 3.5 05/30/2021 09:31 AM   LDLCALC 130 (H) 05/30/2021 09:31 AM    Wt Readings from Last 3 Encounters:  05/30/21 205 lb (93 kg)  11/24/20 193 lb (87.5 kg)  12/17/19 156 lb (70.8 kg)     Objective:    Vital Signs:  There were no vitals taken for this visit.   Physical Exam No physical exam due to telemedicine visit  ASSESSMENT & PLAN:      1. COVID-19 -rest and push fluids -seek emergency medical care for any severe worsening of symptoms or any other concerns -symptom management with OTC URI remedies   2. Influenza A -rest and push fluids  3. Influenza B -rest and push fluids  4. Acute cough - POC COVID-19 - Influenza A/B - POCT rapid strep A - albuterol (VENTOLIN HFA) 108 (90 Base) MCG/ACT inhaler; Inhale 2 puffs into the lungs every 6 (six) hours as needed for wheezing or shortness of breath.  Dispense: 8 g; Refill: 0 - promethazine-dextromethorphan (PROMETHAZINE-DM) 6.25-15 MG/5ML syrup; Take 5 mLs by mouth 4 (four) times daily as needed.  Dispense: 118 mL; Refill: 0  5. Nasal congestion - POC COVID-19 - Influenza A/B - POCT rapid strep A  6. Shortness of breath - albuterol (VENTOLIN HFA) 108 (90 Base) MCG/ACT inhaler; Inhale 2 puffs into the lungs every 6 (six) hours as needed for wheezing or shortness of breath.  Dispense: 8 g; Refill: 0  -Rest and push fluids -Seek emergency medical care for any severe worsening of symptoms    COVID-19 Education: The signs and symptoms of COVID-19 were discussed with the patient and how to seek care for testing (follow up with PCP or arrange E-visit). The importance of social distancing was discussed today.   I spent 10 minutes dedicated to the care of this patient on the date of this encounter to include face-to-face time with the  patient, as well as: EMR and prescription medication management.   Follow Up:  In Person prn  Signed, Janie Morning, NP  09/04/2021 4:34 PM    [facility] [place]

## 2021-09-07 ENCOUNTER — Other Ambulatory Visit: Payer: Self-pay | Admitting: Family Medicine

## 2021-09-07 NOTE — Telephone Encounter (Signed)
Refill sent to pharmacy.   

## 2021-09-26 ENCOUNTER — Ambulatory Visit: Payer: BC Managed Care – PPO | Admitting: Family Medicine

## 2021-09-26 ENCOUNTER — Encounter: Payer: Self-pay | Admitting: Family Medicine

## 2021-09-26 VITALS — BP 132/68 | HR 81 | Temp 98.7°F | Ht 64.5 in | Wt 204.0 lb

## 2021-09-26 DIAGNOSIS — R1310 Dysphagia, unspecified: Secondary | ICD-10-CM

## 2021-09-26 DIAGNOSIS — J020 Streptococcal pharyngitis: Secondary | ICD-10-CM | POA: Diagnosis not present

## 2021-09-26 DIAGNOSIS — F330 Major depressive disorder, recurrent, mild: Secondary | ICD-10-CM | POA: Diagnosis not present

## 2021-09-26 LAB — POCT RAPID STREP A (OFFICE): Rapid Strep A Screen: NEGATIVE

## 2021-09-26 MED ORDER — CEFDINIR 300 MG PO CAPS: 300.0000 mg | ORAL_CAPSULE | Freq: Two times a day (BID) | ORAL | 0 refills | Status: DC

## 2021-09-26 MED ORDER — CLONAZEPAM 0.5 MG PO TABS: 0.5000 mg | ORAL_TABLET | Freq: Every day | ORAL | 2 refills | Status: DC

## 2021-09-26 MED ORDER — CEFTRIAXONE SODIUM 1 G IJ SOLR
1.0000 g | Freq: Once | INTRAMUSCULAR | Status: AC
  Administered 2021-09-26: 1 g via INTRAMUSCULAR

## 2021-09-26 NOTE — Progress Notes (Signed)
P   Acute Office Visit  Subjective:    Patient ID: Lindsay Gregory, female    DOB: 23-Nov-1960, 60 y.o.   MRN: [id number]  Chief Complaint  Patient presents with   Sore Throat   HPI: Patient is in today for congestion, cough, and earache. Throat is very sore. Painful swallowing.  Sore Throat  Associated symptoms include congestion, coughing and ear pain. Pertinent negatives include no abdominal pain, diarrhea, headaches, shortness of breath or vomiting.    Past Medical History:  Diagnosis Date   Anxiety    Plantar fasciitis     Past Surgical History:  Procedure Laterality Date   ABLATION  02/03/2007   CERVICAL SPINE SURGERY  2013    History reviewed. No pertinent family history.  Social History   Socioeconomic History   Marital status: Divorced    Spouse name: Not on file   Number of children: Not on file   Years of education: Not on file   Highest education level: Not on file  Occupational History   Not on file  Tobacco Use   Smoking status: Never   Smokeless tobacco: Never  Substance and Sexual Activity   Alcohol use: Not on file   Drug use: Not on file   Sexual activity: Not Currently    Birth control/protection: Surgical    Comment: ablation  Other Topics Concern   Not on file  Social History Narrative   Not on file   Social Determinants of Health   Financial Resource Strain: Not on file  Food Insecurity: Not on file  Transportation Needs: Not on file  Physical Activity: Not on file  Stress: Not on file  Social Connections: Not on file  Intimate Partner Violence: Not on file    Outpatient Medications Prior to Visit  Medication Sig Dispense Refill   albuterol (VENTOLIN HFA) 108 (90 Base) MCG/ACT inhaler Inhale 2 puffs into the lungs every 6 (six) hours as needed for wheezing or shortness of breath. 8 g 0   escitalopram (LEXAPRO) 10 MG tablet Take 1 tablet (10 mg total) by mouth daily. 90 tablet 3   mometasone (NASONEX) 50 MCG/ACT nasal spray  PLACE 2 SPRAYS INTO THE NOSE DAILY. 17 each 2   clonazePAM (KLONOPIN) 0.5 MG tablet Take 1 tablet (0.5 mg total) by mouth daily. 30 tablet 1   promethazine-dextromethorphan (PROMETHAZINE-DM) 6.25-15 MG/5ML syrup Take 5 mLs by mouth 4 (four) times daily as needed. 118 mL 0   No facility-administered medications prior to visit.    Allergies  Allergen Reactions   Amoxicillin Hives and Itching    Review of Systems  Constitutional:  Negative for fatigue and fever.  HENT:  Positive for congestion, ear pain and sore throat. Negative for sinus pressure.   Eyes:  Negative for pain.  Respiratory:  Positive for cough. Negative for chest tightness, shortness of breath and wheezing.   Cardiovascular:  Negative for chest pain and palpitations.  Gastrointestinal:  Negative for abdominal pain, constipation, diarrhea, nausea and vomiting.  Genitourinary:  Negative for dysuria and hematuria.  Musculoskeletal:  Negative for arthralgias, back pain, joint swelling and myalgias.  Skin:  Negative for rash.  Neurological:  Negative for dizziness, weakness and headaches.  Psychiatric/Behavioral:  Negative for dysphoric mood. The patient is not nervous/anxious.       Objective:    Physical Exam Vitals reviewed.  Constitutional:      Appearance: Normal appearance. She is normal weight.  HENT:     Right Ear: Tympanic  membrane, ear canal and external ear normal.     Left Ear: Tympanic membrane, ear canal and external ear normal.     Nose: Congestion present.     Mouth/Throat:     Pharynx: Pharyngeal swelling, oropharyngeal exudate and posterior oropharyngeal erythema present.  Neck:     Vascular: No carotid bruit.  Cardiovascular:     Rate and Rhythm: Normal rate and regular rhythm.     Pulses: Normal pulses.     Heart sounds: Normal heart sounds. No murmur heard. Pulmonary:     Effort: Pulmonary effort is normal. No respiratory distress.     Breath sounds: Normal breath sounds.  Abdominal:      General: Abdomen is flat.     Tenderness: There is no abdominal tenderness.  Neurological:     Mental Status: She is alert.  Psychiatric:        Mood and Affect: Mood normal.        Behavior: Behavior normal.    BP 132/68 (BP Location: Right Arm, Patient Position: Sitting)   Pulse 81   Temp 98.7 F (37.1 C) (Temporal)   Ht 5' 4.5" (1.638 m)   Wt 204 lb (92.5 kg)   SpO2 99%   BMI 34.48 kg/m  Wt Readings from Last 3 Encounters:  10/18/21 193 lb (87.5 kg)  09/26/21 204 lb (92.5 kg)  05/30/21 205 lb (93 kg)    Health Maintenance Due  Topic Date Due   HIV Screening  Never done   Hepatitis C Screening  Never done   COLONOSCOPY (Pts 45-19yr Insurance coverage will need to be confirmed)  Never done   Zoster Vaccines- Shingrix (1 of 2) Never done   INFLUENZA VACCINE  Never done   COVID-19 Vaccine (5 - Booster for [place]) 07/25/2021    There are no preventive care reminders to display for this patient.   Lab Results  Component Value Date   TSH 1.480 05/30/2021   Lab Results  Component Value Date   WBC 5.8 05/30/2021   HGB 13.0 05/30/2021   HCT 39.5 05/30/2021   MCV 86 05/30/2021   PLT 259 05/30/2021   Lab Results  Component Value Date   NA 140 05/30/2021   K 5.0 05/30/2021   CO2 23 05/30/2021   GLUCOSE 100 (H) 05/30/2021   BUN 15 05/30/2021   CREATININE 1.12 (H) 05/30/2021   BILITOT 0.6 05/30/2021   ALKPHOS 82 05/30/2021   AST 16 05/30/2021   ALT 13 05/30/2021   PROT 6.8 05/30/2021   ALBUMIN 4.6 05/30/2021   CALCIUM 9.8 05/30/2021   EGFR 57 (L) 05/30/2021   Lab Results  Component Value Date   CHOL 208 (H) 05/30/2021   Lab Results  Component Value Date   HDL 60 05/30/2021   Lab Results  Component Value Date   LDLCALC 130 (H) 05/30/2021   Lab Results  Component Value Date   TRIG 104 05/30/2021   Lab Results  Component Value Date   CHOLHDL 3.5 05/30/2021   No results found for: HGBA1C       Assessment & Plan:   Problem List Items  Addressed This Visit       Respiratory   Pharyngitis due to Streptococcus species    Rocephin 1 gm Start on cefdinir.       Relevant Orders   Upper Respiratory Culture, Routine (Completed)     Digestive   Dysphagia - Primary    Gargle warm salt water.  Relevant Orders   POCT rapid strep A (Completed)     Other   Depression, major, recurrent, mild (HCC)    Continue lexapro 10 mg once daily.  Continue clonazepam.      Relevant Medications   clonazePAM (KLONOPIN) 0.5 MG tablet     Meds ordered this encounter  Medications   cefTRIAXone (ROCEPHIN) injection 1 g   DISCONTD: cefdinir (OMNICEF) 300 MG capsule    Sig: Take 1 capsule (300 mg total) by mouth 2 (two) times daily.    Dispense:  20 capsule    Refill:  0   clonazePAM (KLONOPIN) 0.5 MG tablet    Sig: Take 1 tablet (0.5 mg total) by mouth daily.    Dispense:  30 tablet    Refill:  2     I,Lauren M Auman,acting as a scribe for Rochel Brome, MD.,have documented all relevant documentation on the behalf of Rochel Brome, MD,as directed by  Rochel Brome, MD while in the presence of Rochel Brome, MD.   Rochel Brome, MD

## 2021-09-30 LAB — UPPER RESPIRATORY CULTURE, ROUTINE

## 2021-10-18 ENCOUNTER — Ambulatory Visit (INDEPENDENT_AMBULATORY_CARE_PROVIDER_SITE_OTHER): Payer: BC Managed Care – PPO | Admitting: Physician Assistant

## 2021-10-18 ENCOUNTER — Encounter: Payer: Self-pay | Admitting: Physician Assistant

## 2021-10-18 VITALS — Ht 65.0 in | Wt 193.0 lb

## 2021-10-18 DIAGNOSIS — J060 Acute laryngopharyngitis: Secondary | ICD-10-CM | POA: Diagnosis not present

## 2021-10-18 DIAGNOSIS — U071 COVID-19: Secondary | ICD-10-CM

## 2021-10-18 LAB — POCT INFLUENZA A/B
Influenza A, POC: NEGATIVE
Influenza B, POC: NEGATIVE

## 2021-10-18 LAB — POC COVID19 BINAXNOW
SARS Coronavirus 2 Ag: NEGATIVE
SARS Coronavirus 2 Ag: POSITIVE — AB

## 2021-10-18 MED ORDER — PROMETHAZINE-DM 6.25-15 MG/5ML PO SYRP: 5.0000 mL | ORAL_SOLUTION | Freq: Four times a day (QID) | ORAL | 0 refills | Status: DC | PRN

## 2021-10-18 NOTE — Progress Notes (Signed)
Virtual Visit via Telephone Note   This visit type was conducted due to national recommendations for restrictions regarding the COVID-19 Pandemic (e.g. social distancing) in an effort to limit this patient's exposure and mitigate transmission in our community.  Due to her co-morbid illnesses, this patient is at least at moderate risk for complications without adequate follow up.  This format is felt to be most appropriate for this patient at this time.  The patient did not have access to video technology/had technical difficulties with video requiring transitioning to audio format only (telephone).  All issues noted in this document were discussed and addressed.  No physical exam could be performed with this format.  Patient verbally consented to a telehealth visit.   Date:  10/18/2021   ID:  Lindsay Gregory, DOB 1961/07/08, MRN [id number]  Patient Location: Home Provider Location: Office  PCP:  Blane Ohara, MD  Chief Complaint:  complains of cough and sore throat  History of Present Illness:    Lindsay Gregory is a 60 y.o. female with sore throat, cough and congestion that started 2 days ago - she has not had chills or body aches - questionable fever  Of note pt was told she was positive for flu and COVID in October - got some better from that and then 11/15 treated for nonstrep pharyngitis  The patient does have symptoms concerning for COVID-19 infection (fever, chills, cough, or new shortness of breath).    Past Medical History:  Diagnosis Date   Anxiety    Plantar fasciitis    Past Surgical History:  Procedure Laterality Date   ABLATION  02/03/2007   CERVICAL SPINE SURGERY  2013     Current Meds  Medication Sig   albuterol (VENTOLIN HFA) 108 (90 Base) MCG/ACT inhaler Inhale 2 puffs into the lungs every 6 (six) hours as needed for wheezing or shortness of breath.   clonazePAM (KLONOPIN) 0.5 MG tablet Take 1 tablet (0.5 mg total) by mouth daily.   escitalopram  (LEXAPRO) 10 MG tablet Take 1 tablet (10 mg total) by mouth daily.   mometasone (NASONEX) 50 MCG/ACT nasal spray PLACE 2 SPRAYS INTO THE NOSE DAILY.   promethazine-dextromethorphan (PROMETHAZINE-DM) 6.25-15 MG/5ML syrup Take 5 mLs by mouth 4 (four) times daily as needed for cough.     Allergies:   Amoxicillin   Social History   Tobacco Use   Smoking status: Never   Smokeless tobacco: Never     Family Hx: The patient's family history is not on file.  ROS:   Please see the history of present illness.    All other systems reviewed and are negative.  Labs/Other Tests and Data Reviewed:    Recent Labs: 05/30/2021: ALT 13; BUN 15; Creatinine, Ser 1.12; Hemoglobin 13.0; Platelets 259; Potassium 5.0; Sodium 140; TSH 1.480   Recent Lipid Panel Lab Results  Component Value Date/Time   CHOL 208 (H) 05/30/2021 09:31 AM   TRIG 104 05/30/2021 09:31 AM   HDL 60 05/30/2021 09:31 AM   CHOLHDL 3.5 05/30/2021 09:31 AM   LDLCALC 130 (H) 05/30/2021 09:31 AM    Wt Readings from Last 3 Encounters:  10/18/21 193 lb (87.5 kg)  09/26/21 204 lb (92.5 kg)  05/30/21 205 lb (93 kg)     Objective:    Vital Signs:  Ht 5\' 5"  (1.651 m)   Wt 193 lb (87.5 kg)   BMI 32.12 kg/m    VITAL SIGNS:  reviewed GEN:  no acute distress Office Visit  on 10/18/2021  Component Date Value Ref Range Status   SARS Coronavirus 2 Ag 10/18/2021 Positive (A)  Negative Corrected   Influenza A, POC 10/18/2021 Negative  Negative Final   Influenza B, POC 10/18/2021 Negative  Negative Final    ASSESSMENT & PLAN:    COVID 19 -- recommend rest, fluids and rx sent for cough medicine --- recommend quarantine according to CDC guidelines for 5 days then mask for 5 days -- follow up if any symptoms change or worsen  COVID-19 Education: The signs and symptoms of COVID-19 were discussed with the patient and how to seek care for testing (follow up with PCP or arrange E-visit). The importance of social distancing was discussed  today.  Time:   Today, I have spent 10 minutes with the patient with telehealth technology discussing the above problems.     Medication Adjustments/Labs and Tests Ordered: Current medicines are reviewed at length with the patient today.  Concerns regarding medicines are outlined above.   Tests Ordered: Orders Placed This Encounter  Procedures   POC COVID-19 BinaxNow   POCT Influenza A/B    Medication Changes: Meds ordered this encounter  Medications   promethazine-dextromethorphan (PROMETHAZINE-DM) 6.25-15 MG/5ML syrup    Sig: Take 5 mLs by mouth 4 (four) times daily as needed for cough.    Dispense:  118 mL    Refill:  0    Order Specific Question:   Supervising Provider    AnswerCorey Harold    Follow Up:  In Person prn  Signed, Vickey Sages, PA-C  10/18/2021 4:31 PM    [facility]

## 2021-10-22 DIAGNOSIS — J020 Streptococcal pharyngitis: Secondary | ICD-10-CM | POA: Insufficient documentation

## 2021-10-22 DIAGNOSIS — R1310 Dysphagia, unspecified: Secondary | ICD-10-CM | POA: Insufficient documentation

## 2021-10-22 DIAGNOSIS — F330 Major depressive disorder, recurrent, mild: Secondary | ICD-10-CM | POA: Insufficient documentation

## 2021-10-22 LAB — POCT INFLUENZA A/B
Influenza A, POC: NEGATIVE
Influenza B, POC: NEGATIVE

## 2021-10-22 LAB — POCT RAPID STREP A (OFFICE): Rapid Strep A Screen: NEGATIVE

## 2021-10-22 NOTE — Assessment & Plan Note (Signed)
Gargle warm salt water.  °

## 2021-10-22 NOTE — Assessment & Plan Note (Signed)
Continue lexapro 10 mg once daily.  Continue clonazepam.

## 2021-10-22 NOTE — Assessment & Plan Note (Signed)
Rocephin 1 gm Start on cefdinir.

## 2021-11-07 ENCOUNTER — Ambulatory Visit: Payer: BC Managed Care – PPO | Admitting: Nurse Practitioner

## 2022-04-02 ENCOUNTER — Other Ambulatory Visit: Payer: Self-pay

## 2022-04-02 DIAGNOSIS — F330 Major depressive disorder, recurrent, mild: Secondary | ICD-10-CM

## 2022-04-02 MED ORDER — CLONAZEPAM 0.5 MG PO TABS: 0.5000 mg | ORAL_TABLET | Freq: Every day | ORAL | 2 refills | Status: DC

## 2022-04-26 ENCOUNTER — Ambulatory Visit (INDEPENDENT_AMBULATORY_CARE_PROVIDER_SITE_OTHER): Payer: BC Managed Care – PPO | Admitting: Nurse Practitioner

## 2022-04-26 ENCOUNTER — Encounter: Payer: Self-pay | Admitting: Nurse Practitioner

## 2022-04-26 VITALS — BP 128/64 | HR 61 | Temp 97.2°F | Ht 65.5 in | Wt 195.0 lb

## 2022-04-26 DIAGNOSIS — Z1211 Encounter for screening for malignant neoplasm of colon: Secondary | ICD-10-CM

## 2022-04-26 DIAGNOSIS — F411 Generalized anxiety disorder: Secondary | ICD-10-CM

## 2022-04-26 DIAGNOSIS — F330 Major depressive disorder, recurrent, mild: Secondary | ICD-10-CM

## 2022-04-26 DIAGNOSIS — Z1231 Encounter for screening mammogram for malignant neoplasm of breast: Secondary | ICD-10-CM | POA: Diagnosis not present

## 2022-04-26 DIAGNOSIS — R0681 Apnea, not elsewhere classified: Secondary | ICD-10-CM | POA: Diagnosis not present

## 2022-04-26 MED ORDER — ESCITALOPRAM OXALATE 10 MG PO TABS: 10.0000 mg | ORAL_TABLET | Freq: Every day | ORAL | 1 refills | Status: DC

## 2022-04-26 MED ORDER — CLONAZEPAM 0.5 MG PO TABS: 0.5000 mg | ORAL_TABLET | Freq: Every day | ORAL | 1 refills | Status: DC

## 2022-04-26 NOTE — Patient Instructions (Addendum)
Continue medications Return in 6-weeks for fasting physical   Sleep Study, Adult A sleep study (polysomnogram) is a series of tests done while you are sleeping. A sleep study records your brain waves, heart rate, breathing rate, oxygen level, and eye and leg movements. A sleep study helps your health care provider: See how well you sleep. Diagnose a sleep disorder. Determine how severe your sleep disorder is. Create a plan to treat your sleep disorder. Your health care provider may recommend a sleep study if you: Feel sleepy on most days. Snore loudly while sleeping. Have unusual behaviors while you sleep, such as walking. Have brief periods in which you stop breathing during sleep (sleepapnea). Fall asleep suddenly during the day (narcolepsy). Have trouble falling asleep or staying asleep (insomnia). Feel like you need to move your legs when trying to fall asleep (restless legs syndrome). Move your legs by flexing and extending them regularly while asleep (periodic limb movement disorder). Act out your dreams while you sleep (sleep behavior disorder). Feel like you cannot move when you first wake up (sleep paralysis). What tests are part of a sleep study? Most sleep studies record the following during sleep: Brain activity. Eye movements. Heart rate and rhythm. Breathing rate and rhythm. Blood-oxygen level. Blood pressure. Chest and belly movement as you breathe. Arm and leg movements. Snoring or other noises. Body position. Where are sleep studies done? Sleep studies are done at sleep centers. A sleep center may be inside a hospital, office, or clinic. The room where you have the study may look like a hospital room or a hotel room. The health care providers doing the study may come in and out of the room during the study. Most of the time, they will be in another room monitoring your test as you sleep. How are sleep studies done? Most sleep studies are done during a normal  period of time for a full night of sleep. You will arrive at the study center in the evening and go home in the morning. Before the test Bring your pajamas and toothbrush with you to the sleep study. Do not have caffeine on the day of your sleep study. Do not drink alcohol on the day of your sleep study. Your health care provider will let you know if you should stop taking any of your regular medicines before the test. During the test     Round, sticky patches with sensors attached to recording wires (electrodes) are placed on your scalp, face, chest, and limbs. Wires from all the electrodes and sensors run from your bed to a computer. The wires can be taken off and put back on if you need to get out of bed to go to the bathroom. A sensor is placed over your nose to measure airflow. A finger clip is put on your finger or ear to measure your blood oxygen level (pulse oximetry). A belt is placed around your belly and a belt is placed around your chest to measure breathing movements. If you have signs of the sleep disorder called sleep apnea during your test, you may get a treatment mask to wear for the second half of the night. The mask provides positive airway pressure (PAP) to help you breathe better during sleep. This may greatly improve your sleep apnea. You will then have all tests done again with the mask in place to see if your measurements and recordings change. After the test A medical doctor who specializes in sleep will evaluate the results of  your sleep study and share them with you and your primary health care provider. Based on your results, your medical history, and a physical exam, you may be diagnosed with a sleep disorder, such as: Sleep apnea. Restless legs syndrome. Sleep-related behavior disorder. Sleep-related movement disorders. Sleep-related seizure disorders. Your health care team will help determine your treatment options based on your diagnosis. This may  include: Improving your sleep habits (sleep hygiene). Wearing a continuous positive airway pressure (CPAP) or bi-level positive airway pressure (BIPAP) mask. Wearing an oral device at night to improve breathing and reduce snoring. Taking medicines. Follow these instructions at home: Take over-the-counter and prescription medicines only as told by your health care provider. If you are instructed to use a CPAP or BIPAP mask, make sure you use it nightly as directed. Make any lifestyle changes that your health care provider recommends. If you were given a device to open your airway while you sleep, use it only as told by your health care provider. Do not use any tobacco products, such as cigarettes, chewing tobacco, and e-cigarettes. If you need help quitting, ask your health care provider. Keep all follow-up visits as told by your health care provider. This is important. Summary A sleep study (polysomnogram) is a series of tests done while you are sleeping. It shows how well you sleep. Most sleep studies are done over one full night of sleep. You will arrive at the study center in the evening and go home in the morning. If you have signs of the sleep disorder called sleep apnea during your test, you may get a treatment mask to wear for the second half of the night. A medical doctor who specializes in sleep will evaluate the results of your sleep study and share them with your primary health care provider. This information is not intended to replace advice given to you by your health care provider. Make sure you discuss any questions you have with your health care provider. Document Revised: 09/12/2021 Document Reviewed: 08/28/2021 Elsevier Patient Education  2023 Else[employer] Inc.    Preventive Care 41-83 Years Old, Female Preventive care refers to lifestyle choices and visits with your health care provider that can promote health and wellness. Preventive care visits are also called wellness  exams. What can I expect for my preventive care visit? Counseling Your health care provider may ask you questions about your: Medical history, including: Past medical problems. Family medical history. Pregnancy history. Current health, including: Menstrual cycle. Method of birth control. Emotional well-being. Home life and relationship well-being. Sexual activity and sexual health. Lifestyle, including: Alcohol, nicotine or tobacco, and drug use. Access to firearms. Diet, exercise, and sleep habits. Work and work Statistician. Sunscreen use. Safety issues such as seatbelt and bike helmet use. Physical exam Your health care provider will check your: Height and weight. These may be used to calculate your BMI (body mass index). BMI is a measurement that tells if you are at a healthy weight. Waist circumference. This measures the distance around your waistline. This measurement also tells if you are at a healthy weight and may help predict your risk of certain diseases, such as type 2 diabetes and high blood pressure. Heart rate and blood pressure. Body temperature. Skin for abnormal spots. What immunizations do I need?  Vaccines are usually given at various ages, according to a schedule. Your health care provider will recommend vaccines for you based on your age, medical history, and lifestyle or other factors, such as travel or where you  work. What tests do I need? Screening Your health care provider may recommend screening tests for certain conditions. This may include: Lipid and cholesterol levels. Diabetes screening. This is done by checking your blood sugar (glucose) after you have not eaten for a while (fasting). Pelvic exam and Pap test. Hepatitis B test. Hepatitis C test. HIV (human immunodeficiency virus) test. STI (sexually transmitted infection) testing, if you are at risk. Lung cancer screening. Colorectal cancer screening. Mammogram. Talk with your health care  provider about when you should start having regular mammograms. This may depend on whether you have a family history of breast cancer. BRCA-related cancer screening. This may be done if you have a family history of breast, ovarian, tubal, or peritoneal cancers. Bone density scan. This is done to screen for osteoporosis. Talk with your health care provider about your test results, treatment options, and if necessary, the need for more tests. Follow these instructions at home: Eating and drinking  Eat a diet that includes fresh fruits and vegetables, whole grains, lean protein, and low-fat dairy products. Take vitamin and mineral supplements as recommended by your health care provider. Do not drink alcohol if: Your health care provider tells you not to drink. You are pregnant, may be pregnant, or are planning to become pregnant. If you drink alcohol: Limit how much you have to 0-1 drink a day. Know how much alcohol is in your drink. In the [place] one drink equals one 12 oz bottle of beer (355 mL), one 5 oz glass of wine (148 mL), or one 1 oz glass of hard liquor (44 mL). Lifestyle Brush your teeth every morning and night with fluoride toothpaste. Floss one time each day. Exercise for at least 30 minutes 5 or more days each week. Do not use any products that contain nicotine or tobacco. These products include cigarettes, chewing tobacco, and vaping devices, such as e-cigarettes. If you need help quitting, ask your health care provider. Do not use drugs. If you are sexually active, practice safe sex. Use a condom or other form of protection to prevent STIs. If you do not wish to become pregnant, use a form of birth control. If you plan to become pregnant, see your health care provider for a prepregnancy visit. Take aspirin only as told by your health care provider. Make sure that you understand how much to take and what form to take. Work with your health care provider to find out whether it is  safe and beneficial for you to take aspirin daily. Find healthy ways to manage stress, such as: Meditation, yoga, or listening to music. Journaling. Talking to a trusted person. Spending time with friends and family. Minimize exposure to UV radiation to reduce your risk of skin cancer. Safety Always wear your seat belt while driving or riding in a vehicle. Do not drive: If you have been drinking alcohol. Do not ride with someone who has been drinking. When you are tired or distracted. While texting. If you have been using any mind-altering substances or drugs. Wear a helmet and other protective equipment during sports activities. If you have firearms in your house, make sure you follow all gun safety procedures. Seek help if you have been physically or sexually abused. What's next? Visit your health care provider once a year for an annual wellness visit. Ask your health care provider how often you should have your eyes and teeth checked. Stay up to date on all vaccines. This information is not intended to replace advice given  to you by your health care provider. Make sure you discuss any questions you have with your health care provider. Document Revised: 04/26/2021 Document Reviewed: 04/26/2021 Elsevier Patient Education  [place].

## 2022-04-26 NOTE — Progress Notes (Signed)
Subjective:  Patient ID: Lindsay Gregory, female    DOB: February 23, 1961  Age: 61 y.o. MRN: [id number]  Chief Complaint  Patient presents with   Anxiety   Depression   HPI: Pt presents for chronic follow-up of depression and anxiety. She is not fasting today, has agreed to return for fasting CPE in 6-weeks after she returns from [place].   She is concerned about a soft tissue swelling at anterior neck incision scar. Denies pain/tenderness or difficulty swallowing.  Lindsay Gregory tells me she has had family members and friends inform that she has apnea when she sleep. States she feels fatigue despite sleeping well.    Anxiety, Follow-up  She was last seen for anxiety 18 months ago. Current treatment includes Klonopin. Not currently in counseling.   She reports excellent compliance with treatment. She reports excellent tolerance of treatment. She is not having side effects.   She feels her anxiety is mild and Unchanged since last visit.   GAD-7 Results    04/26/2022    3:28 PM 05/30/2021    8:46 AM 11/24/2020    4:01 PM  GAD-7 Generalized Anxiety Disorder Screening Tool  1. Feeling Nervous, Anxious, or on Edge 1 0 0  2. Not Being Able to Stop or Control Worrying 1 1 1   3. Worrying Too Much About Different Things 1 1 0  4. Trouble Relaxing 0 1 0  5. Being So Restless it's Hard To Sit Still 0 0 0  6. Becoming Easily Annoyed or Irritable 1 0 0  7. Feeling Afraid As If Something Awful Might Happen 1 0 0  Total GAD-7 Score 5 3 1   Difficulty At Work, Home, or Getting  Along With Others? Not difficult at all Not difficult at all Somewhat difficult    Depression, Follow-up  She  was last seen for this 11 months ago. Current treatment includes lexapro.      04/26/2022    3:31 PM 05/30/2021    8:45 AM 11/24/2020    3:58 PM  Depression screen PHQ 2/9  Decreased Interest 1 1 1   Down, Depressed, Hopeless 1 1 0  PHQ - 2 Score 2 2 1   Altered sleeping 0 1 0  Tired, decreased energy 3  1 0  Change in appetite 3 1 1   Feeling bad or failure about yourself  1 1 1   Trouble concentrating 0 1 0  Moving slowly or fidgety/restless 0 0 0  Suicidal thoughts 0 0 0  PHQ-9 Score 9 7 3   Difficult doing work/chores Not difficult at all Very difficult Somewhat difficult       Current Outpatient Medications on File Prior to Visit  Medication Sig Dispense Refill   albuterol (VENTOLIN HFA) 108 (90 Base) MCG/ACT inhaler Inhale 2 puffs into the lungs every 6 (six) hours as needed for wheezing or shortness of breath. 8 g 0   clonazePAM (KLONOPIN) 0.5 MG tablet Take 1 tablet (0.5 mg total) by mouth daily. 30 tablet 2   escitalopram (LEXAPRO) 10 MG tablet Take 1 tablet (10 mg total) by mouth daily. 90 tablet 3   mometasone (NASONEX) 50 MCG/ACT nasal spray PLACE 2 SPRAYS INTO THE NOSE DAILY. 17 each 2   No current facility-administered medications on file prior to visit.   Past Medical History:  Diagnosis Date   Anxiety    Plantar fasciitis    Past Surgical History:  Procedure Laterality Date   ABLATION  02/03/2007   CERVICAL SPINE SURGERY  2013  No family history on file. Social History   Socioeconomic History   Marital status: Divorced    Spouse name: Not on file   Number of children: Not on file   Years of education: Not on file   Highest education level: Not on file  Occupational History   Not on file  Tobacco Use   Smoking status: Never   Smokeless tobacco: Never  Substance and Sexual Activity   Alcohol use: Not on file   Drug use: Not on file   Sexual activity: Not Currently    Birth control/protection: Surgical    Comment: ablation  Other Topics Concern   Not on file  Social History Narrative   Not on file   Social Determinants of Health   Financial Resource Strain: Not on file  Food Insecurity: Not on file  Transportation Needs: Not on file  Physical Activity: Not on file  Stress: Not on file  Social Connections: Not on file    Review of Systems   Constitutional:  Negative for chills, fatigue and fever.  HENT:  Negative for congestion, ear pain, rhinorrhea and sore throat.   Respiratory:  Negative for cough and shortness of breath.   Cardiovascular:  Negative for chest pain.  Gastrointestinal:  Negative for abdominal pain, constipation, diarrhea, nausea and vomiting.  Genitourinary:  Negative for dysuria and urgency.  Musculoskeletal:  Negative for back pain and myalgias.  Neurological:  Negative for dizziness, weakness, light-headedness and headaches.  Psychiatric/Behavioral:  Positive for depression. Negative for dysphoric mood. The patient is not nervous/anxious.      Objective:  BP 128/64   Pulse 61   Temp (!) 97.2 F (36.2 C)   Ht 5' 5.5" (1.664 m)   Wt 195 lb (88.5 kg)   SpO2 98%   BMI 31.96 kg/m       04/26/2022    3:23 PM 10/18/2021    3:49 PM 09/26/2021    3:49 PM  BP/Weight  Systolic BP   132  Diastolic BP   68  Wt. (Lbs) 195 193 204  BMI 31.96 kg/m2 32.12 kg/m2 34.48 kg/m2    Physical Exam Vitals reviewed.  Constitutional:      Appearance: Normal appearance.  Eyes:     Comments: Eye glasses in place  Neck:     Comments: Right anterior neck soft tissue mass at scar, approximately 1 cm in diameter Skin:    General: Skin is warm and dry.     Capillary Refill: Capillary refill takes less than 2 seconds.  Neurological:     General: No focal deficit present.     Mental Status: She is alert and oriented to person, place, and time.  Psychiatric:        Mood and Affect: Mood normal.        Behavior: Behavior normal.        Lab Results  Component Value Date   WBC 5.8 05/30/2021   HGB 13.0 05/30/2021   HCT 39.5 05/30/2021   PLT 259 05/30/2021   GLUCOSE 100 (H) 05/30/2021   CHOL 208 (H) 05/30/2021   TRIG 104 05/30/2021   HDL 60 05/30/2021   LDLCALC 130 (H) 05/30/2021   ALT 13 05/30/2021   AST 16 05/30/2021   NA 140 05/30/2021   K 5.0 05/30/2021   CL 102 05/30/2021   CREATININE 1.12 (H)  05/30/2021   BUN 15 05/30/2021   CO2 23 05/30/2021   TSH 1.480 05/30/2021      Assessment &  Plan:   1. Depression, major, recurrent, mild (HCC) - escitalopram (LEXAPRO) 10 MG tablet; Take 1 tablet (10 mg total) by mouth daily.  Dispense: 90 tablet; Refill: 1 - Ambulatory referral to Sleep Studies  2. GAD (generalized anxiety disorder) - clonazePAM (KLONOPIN) 0.5 MG tablet; Take 1 tablet (0.5 mg total) by mouth daily.  Dispense: 30 tablet; Refill: 1  3. Witnessed episode of apnea - Ambulatory referral to Sleep Studies  4. Screening mammogram for breast cancer - MM DIGITAL SCREENING BILATERAL; Future  5. Encounter for screening colonoscopy - Cologuard     Continue medications Return in 6-weeks for fasting physical  Follow-up: 6-week, CPE, fasting  An After Visit Summary was printed and given to the patient.  I, Rip Harbour, NP, have reviewed all documentation for this visit. The documentation on 04/26/22 for the exam, diagnosis, procedures, and orders are all accurate and complete.   Signed, Rip Harbour, NP [place] [phone]

## 2022-05-22 ENCOUNTER — Ambulatory Visit (INDEPENDENT_AMBULATORY_CARE_PROVIDER_SITE_OTHER): Payer: BC Managed Care – PPO | Admitting: Nurse Practitioner

## 2022-05-22 ENCOUNTER — Encounter: Payer: Self-pay | Admitting: Nurse Practitioner

## 2022-05-22 VITALS — BP 98/62 | HR 65 | Ht 65.25 in | Wt 194.0 lb

## 2022-05-22 DIAGNOSIS — Z780 Asymptomatic menopausal state: Secondary | ICD-10-CM

## 2022-05-22 DIAGNOSIS — Z01419 Encounter for gynecological examination (general) (routine) without abnormal findings: Secondary | ICD-10-CM | POA: Diagnosis not present

## 2022-05-22 NOTE — Progress Notes (Signed)
   Lindsay Gregory 02-05-1961 [id number]   History:  61 y.o. G1P1001 presents for annual exam. Postmenopausal - no HRT, no bleeding. Normal pap history. Anxiety managed by PCP.   Gynecologic History No LMP recorded. Patient has had an ablation.   Contraception/Family planning: post menopausal status Sexually active: No  Health Maintenance Last Pap: 05/29/2019. Results were: Normal, 5-year repeat Last mammogram: 12/30/2019. Results were: Normal. Scheduled 06/11/2022 Last colonoscopy: Never. Cologuard ordered by PCP  Last Dexa: Not indicated   Past medical history, past surgical history, family history and social history were all reviewed and documented in the EPIC chart. Single. 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 46 yo son, married, in Social worker school. Has hobby farm.   ROS:  A ROS was performed and pertinent positives and negatives are included.  Exam:  Vitals:   05/22/22 1055  BP: 98/62  Pulse: 65  SpO2: 97%  Weight: 194 lb (88 kg)  Height: 5' 5.25" (1.657 m)   Body mass index is 32.04 kg/m.  General appearance:  Normal Thyroid:  Symmetrical, normal in size, without palpable masses or nodularity. Respiratory  Auscultation:  Clear without wheezing or rhonchi Cardiovascular  Auscultation:  Regular rate, without rubs, murmurs or gallops  Edema/varicosities:  Not grossly evident Abdominal  Soft,nontender, without masses, guarding or rebound.  Liver/spleen:  No organomegaly noted  Hernia:  None appreciated  Skin  Inspection:  Grossly normal Breasts: Examined lying and sitting.   Right: Without masses, retractions, nipple discharge or axillary adenopathy.   Left: Without masses, retractions, nipple discharge or axillary adenopathy. Genitourinary   Inguinal/mons:  Normal without inguinal adenopathy  External genitalia:  Normal appearing vulva with no masses, tenderness, or lesions  BUS/Urethra/Skene's glands:  Normal  Vagina:  Normal appearing with  normal color and discharge, no lesions  Cervix:  Normal appearing without discharge or lesions  Uterus:  Difficult to palpate due to body habitus but no gross masses or tenderness  Adnexa/parametria:     Rt: Normal in size, without masses or tenderness.   Lt: Normal in size, without masses or tenderness.  Anus and perineum: Normal  Digital rectal exam: Normal sphincter tone without palpated masses or tenderness  Patient informed chaperone available to be present for breast and pelvic exam. Patient has requested no chaperone to be present. Patient has been advised what will be completed during breast and pelvic exam.   Assessment/Plan:  61 y.o. G1P1001 for annual exam.   Well female exam with routine gynecological exam - Education provided on SBEs, importance of preventative screenings, current guidelines, high calcium diet, regular exercise, and multivitamin daily.  Labs with PCP.   Postmenopausal - no HRT, no bleeding.   Screening for cervical cancer - Normal Pap history.  Will repeat at 5-year interval per guidelines.  Screening for breast cancer - Normal mammogram history. Overdue but scheduled 7/31. Normal breast exam today.  Screening for colon cancer - Has not had screening done. Cologuard ordered by PCP last month.   Screening for osteoporosis - Average risk. Will plan DXA at age 61.   Return in 1 year for annual.     Olivia Mackie DNP, 11:24 AM 05/22/2022

## 2022-06-06 ENCOUNTER — Encounter: Payer: Self-pay | Admitting: Nurse Practitioner

## 2022-06-06 ENCOUNTER — Ambulatory Visit (INDEPENDENT_AMBULATORY_CARE_PROVIDER_SITE_OTHER): Payer: BC Managed Care – PPO | Admitting: Nurse Practitioner

## 2022-06-06 VITALS — BP 94/62 | HR 78 | Temp 97.4°F | Ht 65.5 in | Wt 195.0 lb

## 2022-06-06 DIAGNOSIS — Z6831 Body mass index (BMI) 31.0-31.9, adult: Secondary | ICD-10-CM

## 2022-06-06 DIAGNOSIS — Z0000 Encounter for general adult medical examination without abnormal findings: Secondary | ICD-10-CM | POA: Diagnosis not present

## 2022-06-06 DIAGNOSIS — M7989 Other specified soft tissue disorders: Secondary | ICD-10-CM | POA: Diagnosis not present

## 2022-06-06 DIAGNOSIS — Z23 Encounter for immunization: Secondary | ICD-10-CM | POA: Diagnosis not present

## 2022-06-06 NOTE — Patient Instructions (Addendum)
Tdap given today in office Follow-up in 1 week for weight management and Shingles vaccine We will call you with lab results Complete Advance Directive packet and return copy to office  Advance Directive  Advance directives are legal documents that allow you to make decisions about your health care and medical treatment in case you become unable to communicate for yourself. Advance directives let your wishes be known to family, friends, and health care providers. Discussing and writing advance directives should happen over time rather than all at once. Advance directives can be changed and updated at any time. There are different types of advance directives, such as: Medical power of attorney. Living will. Do not resuscitate (DNR) order or do not attempt resuscitation (DNAR) order. Health care proxy and medical power of attorney A health care proxy is also called a health care agent. This person is appointed to make medical decisions for you when you are unable to make decisions for yourself. Generally, people ask a trusted friend or family member to act as their proxy and represent their preferences. Make sure you have an agreement with your trusted person to act as your proxy. A proxy may have to make a medical decision on your behalf if your wishes are not known. A medical power of attorney, also called a durable power of attorney for health care, is a legal document that names your health care proxy. Depending on the laws in your state, the document may need to be: Signed. Notarized. Dated. Copied. Witnessed. Incorporated into your medical record. You may also want to appoint a trusted person to manage your money in the event you are unable to do so. This is called a durable power of attorney for finances. It is a separate legal document from the durable power of attorney for health care. You may choose your health care proxy or someone different to act as your agent in money matters. If you  do not appoint a proxy, or there is a concern that the proxy is not acting in your best interest, a court may appoint a guardian to act on your behalf. Living will A living will is a set of instructions that state your wishes about medical care when you cannot express them yourself. Health care providers should keep a copy of your living will in your medical record. You may want to give a copy to family members or friends. To alert caregivers in case of an emergency, you can place a card in your wallet to let them know that you have a living will and where they can find it. A living will is used if you become: Terminally ill. Disabled. Unable to communicate or make decisions. The following decisions should be included in your living will: To use or not to use life support equipment, such as dialysis machines and breathing machines (ventilators). Whether you want a DNR or DNAR order. This tells health care providers not to use cardiopulmonary resuscitation (CPR) if breathing or heartbeat stops. To use or not to use tube feeding. To be given or not to be given food and fluids. Whether you want comfort (palliative) care when the goal becomes comfort rather than a cure. Whether you want to donate your organs and tissues. A living will does not give instructions for distributing your money and property if you should pass away. DNR or DNAR A DNR or DNAR order is a request not to have CPR in the event that your heart stops beating or you stop  breathing. If a DNR or DNAR order has not been made and shared, a health care provider will try to help any patient whose heart has stopped or who has stopped breathing. If you plan to have surgery, talk with your health care provider about how your DNR or DNAR order will be followed if problems occur. What if I do not have an advance directive? Some states assign family decision makers to act on your behalf if you do not have an advance directive. Each state has its  own laws about advance directives. You may want to check with your health care provider, attorney, or state representative about the laws in your state. Summary Advance directives are legal documents that allow you to make decisions about your health care and medical treatment in case you become unable to communicate for yourself. The process of discussing and writing advance directives should happen over time. You can change and update advance directives at any time. Advance directives may include a medical power of attorney, a living will, and a DNR or DNAR order. This information is not intended to replace advice given to you by your health care provider. Make sure you discuss any questions you have with your health care provider. Document Revised: 08/02/2020 Document Reviewed: 08/02/2020 Elsevier Patient Education  [place].  Tdap (Tetanus, Diphtheria, Pertussis) Vaccine: What You Need to Know 1. Why get vaccinated? Tdap vaccine can prevent tetanus, diphtheria, and pertussis. Diphtheria and pertussis spread from person to person. Tetanus enters the body through cuts or wounds. TETANUS (T) causes painful stiffening of the muscles. Tetanus can lead to serious health problems, including being unable to open the mouth, having trouble swallowing and breathing, or death. DIPHTHERIA (D) can lead to difficulty breathing, heart failure, paralysis, or death. PERTUSSIS (aP), also known as "whooping cough," can cause uncontrollable, violent coughing that makes it hard to breathe, eat, or drink. Pertussis can be extremely serious especially in babies and young children, causing pneumonia, convulsions, brain damage, or death. In teens and adults, it can cause weight loss, loss of bladder control, passing out, and rib fractures from severe coughing. 2. Tdap vaccine Tdap is only for children 7 years and older, adolescents, and adults.  Adolescents should receive a single dose of Tdap, preferably at  age 45 or 19 years. Pregnant people should get a dose of Tdap during every pregnancy, preferably during the early part of the third trimester, to help protect the newborn from pertussis. Infants are most at risk for severe, life-threatening complications from pertussis. Adults who have never received Tdap should get a dose of Tdap. Also, adults should receive a booster dose of either Tdap or Td (a different vaccine that protects against tetanus and diphtheria but not pertussis) every 10 years, or after 5 years in the case of a severe or dirty wound or burn. Tdap may be given at the same time as other vaccines. 3. Talk with your health care provider Tell your vaccine provider if the person getting the vaccine: Has had an allergic reaction after a previous dose of any vaccine that protects against tetanus, diphtheria, or pertussis, or has any severe, life-threatening allergies Has had a coma, decreased level of consciousness, or prolonged seizures within 7 days after a previous dose of any pertussis vaccine (DTP, DTaP, or Tdap) Has seizures or another nervous system problem Has ever had Guillain-Barr Syndrome (also called "GBS") Has had severe pain or swelling after a previous dose of any vaccine that protects against tetanus  or diphtheria In some cases, your health care provider may decide to postpone Tdap vaccination until a future visit. People with minor illnesses, such as a cold, may be vaccinated. People who are moderately or severely ill should usually wait until they recover before getting Tdap vaccine.  Your health care provider can give you more information. 4. Risks of a vaccine reaction Pain, redness, or swelling where the shot was given, mild fever, headache, feeling tired, and nausea, vomiting, diarrhea, or stomachache sometimes happen after Tdap vaccination. People sometimes faint after medical procedures, including vaccination. Tell your provider if you feel dizzy or have vision  changes or ringing in the ears.  As with any medicine, there is a very remote chance of a vaccine causing a severe allergic reaction, other serious injury, or death. 5. What if there is a serious problem? An allergic reaction could occur after the vaccinated person leaves the clinic. If you see signs of a severe allergic reaction (hives, swelling of the face and throat, difficulty breathing, a fast heartbeat, dizziness, or weakness), call 9-1-1 and get the person to the nearest hospital. For other signs that concern you, call your health care provider.  Adverse reactions should be reported to the Vaccine Adverse Event Reporting System (VAERS). Your health care provider will usually file this report, or you can do it yourself. Visit the VAERS website at www.vaers.SamedayNews.es or call 847-167-2152. VAERS is only for reporting reactions, and VAERS staff members do not give medical advice. 6. The National Vaccine Injury Compensation Program The [employer] Vaccine Injury Compensation Program (VICP) is a federal program that was created to compensate people who may have been injured by certain vaccines. Claims regarding alleged injury or death due to vaccination have a time limit for filing, which may be as short as two years. Visit the VICP website at GoldCloset.com.ee or call 509 774 6203 to learn about the program and about filing a claim. 7. How can I learn more? Ask your health care provider. Call your local or state health department. Visit the website of the Food and Drug Administration (FDA) for vaccine package inserts and additional information at TraderRating.uy. Contact the Centers for Disease Control and Prevention (CDC): Call 216-536-9923 (1-800-CDC-INFO) or Visit CDC's website at http://hunter.com/. Source: CDC Vaccine Information Statement Tdap (Tetanus, Diphtheria, Pertussis) Vaccine (06/17/2020) This same material is available at http://www.wolf.info/  for no charge. This information is not intended to replace advice given to you by your health care provider. Make sure you discuss any questions you have with your health care provider. Document Revised: 09/27/2021 Document Reviewed: 07/31/2021 Elsevier Patient Education  2023 Elsevier Inc.  Preventive Care 40-24 Years Old, Female Preventive care refers to lifestyle choices and visits with your health care provider that can promote health and wellness. Preventive care visits are also called wellness exams. What can I expect for my preventive care visit? Counseling Your health care provider may ask you questions about your: Medical history, including: Past medical problems. Family medical history. Pregnancy history. Current health, including: Menstrual cycle. Method of birth control. Emotional well-being. Home life and relationship well-being. Sexual activity and sexual health. Lifestyle, including: Alcohol, nicotine or tobacco, and drug use. Access to firearms. Diet, exercise, and sleep habits. Work and work Statistician. Sunscreen use. Safety issues such as seatbelt and bike helmet use. Physical exam Your health care provider will check your: Height and weight. These may be used to calculate your BMI (body mass index). BMI is a measurement that tells if you are at a  healthy weight. Waist circumference. This measures the distance around your waistline. This measurement also tells if you are at a healthy weight and may help predict your risk of certain diseases, such as type 2 diabetes and high blood pressure. Heart rate and blood pressure. Body temperature. Skin for abnormal spots. What immunizations do I need?  Vaccines are usually given at various ages, according to a schedule. Your health care provider will recommend vaccines for you based on your age, medical history, and lifestyle or other factors, such as travel or where you work. What tests do I need? Screening Your  health care provider may recommend screening tests for certain conditions. This may include: Lipid and cholesterol levels. Diabetes screening. This is done by checking your blood sugar (glucose) after you have not eaten for a while (fasting). Pelvic exam and Pap test. Hepatitis B test. Hepatitis C test. HIV (human immunodeficiency virus) test. STI (sexually transmitted infection) testing, if you are at risk. Lung cancer screening. Colorectal cancer screening. Mammogram. Talk with your health care provider about when you should start having regular mammograms. This may depend on whether you have a family history of breast cancer. BRCA-related cancer screening. This may be done if you have a family history of breast, ovarian, tubal, or peritoneal cancers. Bone density scan. This is done to screen for osteoporosis. Talk with your health care provider about your test results, treatment options, and if necessary, the need for more tests. Follow these instructions at home: Eating and drinking  Eat a diet that includes fresh fruits and vegetables, whole grains, lean protein, and low-fat dairy products. Take vitamin and mineral supplements as recommended by your health care provider. Do not drink alcohol if: Your health care provider tells you not to drink. You are pregnant, may be pregnant, or are planning to become pregnant. If you drink alcohol: Limit how much you have to 0-1 drink a day. Know how much alcohol is in your drink. In the [place] one drink equals one 12 oz bottle of beer (355 mL), one 5 oz glass of wine (148 mL), or one 1 oz glass of hard liquor (44 mL). Lifestyle Brush your teeth every morning and night with fluoride toothpaste. Floss one time each day. Exercise for at least 30 minutes 5 or more days each week. Do not use any products that contain nicotine or tobacco. These products include cigarettes, chewing tobacco, and vaping devices, such as e-cigarettes. If you need help  quitting, ask your health care provider. Do not use drugs. If you are sexually active, practice safe sex. Use a condom or other form of protection to prevent STIs. If you do not wish to become pregnant, use a form of birth control. If you plan to become pregnant, see your health care provider for a prepregnancy visit. Take aspirin only as told by your health care provider. Make sure that you understand how much to take and what form to take. Work with your health care provider to find out whether it is safe and beneficial for you to take aspirin daily. Find healthy ways to manage stress, such as: Meditation, yoga, or listening to music. Journaling. Talking to a trusted person. Spending time with friends and family. Minimize exposure to UV radiation to reduce your risk of skin cancer. Safety Always wear your seat belt while driving or riding in a vehicle. Do not drive: If you have been drinking alcohol. Do not ride with someone who has been drinking. When you are tired or  distracted. While texting. If you have been using any mind-altering substances or drugs. Wear a helmet and other protective equipment during sports activities. If you have firearms in your house, make sure you follow all gun safety procedures. Seek help if you have been physically or sexually abused. What's next? Visit your health care provider once a year for an annual wellness visit. Ask your health care provider how often you should have your eyes and teeth checked. Stay up to date on all vaccines. This information is not intended to replace advice given to you by your health care provider. Make sure you discuss any questions you have with your health care provider. Document Revised: 04/26/2021 Document Reviewed: 04/26/2021 Elsevier Patient Education  [place].

## 2022-06-06 NOTE — Addendum Note (Signed)
Addended by: Precious Reel on: 06/06/2022 04:51 PM   Modules accepted: Orders

## 2022-06-06 NOTE — Progress Notes (Signed)
Subjective:  Patient ID: Lindsay Gregory, female    DOB: [birth year]  Age: 61 y.o. MRN: [id number]  Chief Complaint  Patient presents with   Annual Exam    HPI Well Adult Physical: Patient here for a comprehensive physical exam.The patient reports  concerned with obesity. Do you take any herbs or supplements that were not prescribed by a doctor? no Are you taking calcium supplements? no Are you taking aspirin daily? no  Encounter for general adult medical examination with abnormal findings  Physical ("At Risk" items are starred): Patient's last physical exam was 1 year ago .  Patient is not afflicted from Stress Incontinence and Urge Incontinence  Patient wears a seat belt, has smoke detectors, has carbon monoxide detectors, practices appropriate gun safety, and wears sunscreen with extended sun exposure. Dental Care: biannual cleanings, brushes and flosses daily. Ophthalmology/Optometry: Annual visit.  Hearing loss: none Vision impairments: wears glasses   Flowsheet Row Office Visit from 04/26/2022 in [facility]  PHQ-2 Total Score 2            Social Hx   Social History   Socioeconomic History   Marital status: Divorced    Spouse name: Not on file   Number of children: Not on file   Years of education: Not on file   Highest education level: Not on file  Occupational History   Not on file  Tobacco Use   Smoking status: Never   Smokeless tobacco: Never  Substance and Sexual Activity   Alcohol use: Yes    Comment: rarely   Drug use: Never   Sexual activity: Not Currently    Birth control/protection: Surgical    Comment: ablation  Other Topics Concern   Not on file  Social History Narrative   Not on file   Social Determinants of Health   Financial Resource Strain: Not on file  Food Insecurity: Not on file  Transportation Needs: Not on file  Physical Activity: Not on file  Stress: Not on file  Social Connections: Not on file   Past Medical  History:  Diagnosis Date   Anxiety    Plantar fasciitis    Past Surgical History:  Procedure Laterality Date   ABLATION  02/03/2007   CERVICAL SPINE SURGERY  2013    Family History  Problem Relation Age of Onset   Ovarian cancer Other        Maternal Great Aunt    Review of Systems  Constitutional:  Negative for appetite change, fatigue and fever.  HENT:  Negative for congestion, ear pain, sinus pressure and sore throat.   Respiratory:  Negative for cough, chest tightness, shortness of breath and wheezing.   Cardiovascular:  Negative for chest pain and palpitations.  Gastrointestinal:  Negative for abdominal pain, constipation, diarrhea, nausea and vomiting.  Genitourinary:  Negative for dysuria and hematuria.  Musculoskeletal:  Negative for arthralgias, back pain, joint swelling and myalgias.  Skin:  Negative for rash.  Neurological:  Negative for dizziness, weakness and headaches.  Psychiatric/Behavioral:  Negative for dysphoric mood. The patient is not nervous/anxious.      Objective:  BP 94/62 (BP Location: Left Arm, Patient Position: Sitting)   Pulse 78   Temp (!) 97.4 F (36.3 C) (Oral)   Ht 5' 5.5" (1.664 m)   Wt 195 lb (88.5 kg)   SpO2 98%   BMI 31.96 kg/m       05/22/2022   10:55 AM 04/26/2022    3:23 PM 10/18/2021  3:49 PM  BP/Weight  Systolic BP 98 128   Diastolic BP 62 64   Wt. (Lbs) 194 195 193  BMI 32.04 kg/m2 31.96 kg/m2 32.12 kg/m2    Physical Exam Vitals reviewed.  Constitutional:      Appearance: Normal appearance.  Neck:      Comments: Soft palpable tissue mass approximately 2 cm in diameter to right anterior neck scar Cardiovascular:     Rate and Rhythm: Normal rate and regular rhythm.     Pulses: Normal pulses.     Heart sounds: Normal heart sounds.  Pulmonary:     Effort: Pulmonary effort is normal.     Breath sounds: Normal breath sounds.  Abdominal:     General: Abdomen is flat. Bowel sounds are normal.     Palpations: Abdomen  is soft.  Neurological:     Mental Status: She is alert and oriented to person, place, and time.  Psychiatric:        Mood and Affect: Mood normal.        Behavior: Behavior normal.    Lab Results  Component Value Date   WBC 5.8 05/30/2021   HGB 13.0 05/30/2021   HCT 39.5 05/30/2021   PLT 259 05/30/2021   GLUCOSE 100 (H) 05/30/2021   CHOL 208 (H) 05/30/2021   TRIG 104 05/30/2021   HDL 60 05/30/2021   LDLCALC 130 (H) 05/30/2021   ALT 13 05/30/2021   AST 16 05/30/2021   NA 140 05/30/2021   K 5.0 05/30/2021   CL 102 05/30/2021   CREATININE 1.12 (H) 05/30/2021   BUN 15 05/30/2021   CO2 23 05/30/2021   TSH 1.480 05/30/2021      Assessment & Plan:   1. Encounter for general adult medical examination without abnormal findings - CBC with Differential/Platelet - Comprehensive metabolic panel - Lipid panel - TSH - VITAMIN D 25 Hydroxy (Vit-D Deficiency, Fractures)  2. BMI 31.0-31.9,adult  3. Soft tissue mass -soft palpable mass noted to right anterior neck at scar. Pt declines imaging or referral at this time. Denies pain, discomfort, or difficulty swallowing. Will continue to monitor per pt request     Tdap given today in office Follow-up in 1 week for weight management and Shingles vaccine We will call you with lab results Complete Advance Directive packet and return copy to office     These are the goals we discussed:  Goals   Weight loss      This is a list of the screening recommended for you and due dates:  Health Maintenance  Topic Date Due   Colon Cancer Screening  Never done   Zoster (Shingles) Vaccine (1 of 2) Never done   COVID-19 Vaccine (5 - Pfizer series) 07/25/2021   Tetanus Vaccine  11/12/2021   Mammogram  12/29/2021   Pap Smear  05/28/2022   Flu Shot  06/12/2022   HPV Vaccine  Aged Out       Follow-up: 1-week, weight management and Shingles vaccine  An After Visit Summary was printed and given to the patient.  I, Janie Morning,  NP, have reviewed all documentation for this visit. The documentation on 06/06/22 for the exam, diagnosis, procedures, and orders are all accurate and complete.   I,Lauren M Auman,acting as a Neurosurgeon for [employer], NP.,have documented all relevant documentation on the behalf of Janie Morning, NP,as directed by  Janie Morning, NP while in the presence of Janie Morning, NP.   Signed, Carollee Herter  Dot Lanes, NP [facility] [phone]

## 2022-06-07 ENCOUNTER — Ambulatory Visit: Payer: BC Managed Care – PPO | Admitting: Neurology

## 2022-06-07 ENCOUNTER — Encounter: Payer: Self-pay | Admitting: Neurology

## 2022-06-07 VITALS — BP 112/79 | HR 61 | Ht 65.0 in | Wt 196.8 lb

## 2022-06-07 DIAGNOSIS — E669 Obesity, unspecified: Secondary | ICD-10-CM | POA: Diagnosis not present

## 2022-06-07 DIAGNOSIS — G4730 Sleep apnea, unspecified: Secondary | ICD-10-CM | POA: Diagnosis not present

## 2022-06-07 DIAGNOSIS — G4719 Other hypersomnia: Secondary | ICD-10-CM | POA: Diagnosis not present

## 2022-06-07 DIAGNOSIS — R0681 Apnea, not elsewhere classified: Secondary | ICD-10-CM

## 2022-06-07 DIAGNOSIS — R0683 Snoring: Secondary | ICD-10-CM | POA: Diagnosis not present

## 2022-06-07 DIAGNOSIS — J351 Hypertrophy of tonsils: Secondary | ICD-10-CM

## 2022-06-07 LAB — COMPREHENSIVE METABOLIC PANEL
ALT: 17 IU/L (ref 0–32)
AST: 19 IU/L (ref 0–40)
Albumin/Globulin Ratio: 1.7 (ref 1.2–2.2)
Albumin: 4.6 g/dL (ref 3.8–4.9)
Alkaline Phosphatase: 80 IU/L (ref 44–121)
BUN/Creatinine Ratio: 15 (ref 9–23)
BUN: 14 mg/dL (ref 6–24)
Bilirubin Total: 0.2 mg/dL (ref 0.0–1.2)
CO2: 22 mmol/L (ref 20–29)
Calcium: 9.9 mg/dL (ref 8.7–10.2)
Chloride: 104 mmol/L (ref 96–106)
Creatinine, Ser: 0.93 mg/dL (ref 0.57–1.00)
Globulin, Total: 2.7 g/dL (ref 1.5–4.5)
Glucose: 112 mg/dL — ABNORMAL HIGH (ref 70–99)
Potassium: 5.6 mmol/L — ABNORMAL HIGH (ref 3.5–5.2)
Sodium: 140 mmol/L (ref 134–144)
Total Protein: 7.3 g/dL (ref 6.0–8.5)
eGFR: 71 mL/min/{1.73_m2} (ref >59)

## 2022-06-07 LAB — TSH: TSH: 1.48 u[IU]/mL (ref 0.450–4.500)

## 2022-06-07 LAB — CBC WITH DIFFERENTIAL/PLATELET
Basophils Absolute: 0.1 10*3/uL (ref 0.0–0.2)
Basos: 1 %
EOS (ABSOLUTE): 0.2 10*3/uL (ref 0.0–0.4)
Eos: 4 %
Hematocrit: 38 % (ref 34.0–46.6)
Hemoglobin: 12.6 g/dL (ref 11.1–15.9)
Immature Grans (Abs): 0 10*3/uL (ref 0.0–0.1)
Immature Granulocytes: 0 %
Lymphocytes Absolute: 1.8 10*3/uL (ref 0.7–3.1)
Lymphs: 33 %
MCH: 27.6 pg (ref 26.6–33.0)
MCHC: 33.2 g/dL (ref 31.5–35.7)
MCV: 83 fL (ref 79–97)
Monocytes Absolute: 0.4 10*3/uL (ref 0.1–0.9)
Monocytes: 8 %
Neutrophils Absolute: 3 10*3/uL (ref 1.4–7.0)
Neutrophils: 54 %
Platelets: 301 10*3/uL (ref 150–450)
RBC: 4.56 x10E6/uL (ref 3.77–5.28)
RDW: 14.5 % (ref 11.7–15.4)
WBC: 5.6 10*3/uL (ref 3.4–10.8)

## 2022-06-07 LAB — VITAMIN D 25 HYDROXY (VIT D DEFICIENCY, FRACTURES): Vit D, 25-Hydroxy: 31.8 ng/mL (ref 30.0–100.0)

## 2022-06-07 LAB — LIPID PANEL
Chol/HDL Ratio: 3.2 ratio (ref 0.0–4.4)
Cholesterol, Total: 214 mg/dL — ABNORMAL HIGH (ref 100–199)
HDL: 67 mg/dL (ref >39)
LDL Chol Calc (NIH): 130 mg/dL — ABNORMAL HIGH (ref 0–99)
Triglycerides: 99 mg/dL (ref 0–149)
VLDL Cholesterol Cal: 17 mg/dL (ref 5–40)

## 2022-06-07 LAB — CARDIOVASCULAR RISK ASSESSMENT

## 2022-06-07 NOTE — Progress Notes (Signed)
Subjective:    Patient ID: BETHZAIDA BOORD is a 61 y.o. female.  HPI    Huston Foley, MD, PhD [facility] [address] [address] [place], [place] [address]  Dear Carollee Herter,   I saw your patient, Wray Kearns, upon your kind request in my sleep clinic today for initial consultation of her sleep disorder, in particular, concern for underlying obstructive sleep apnea.  The patient is unaccompanied today.  As you know, Ms. Larey Seat is a 61 year old right-handed woman with an underlying medical history of allergies, anxiety, depression, planter fasciitis and obesity, who reports snoring and excessive daytime sleepiness as well as witnessed apneas per family and friends.  I reviewed your office note from 04/26/2022.  Her Epworth sleepiness score is 14 out of 24, fatigue severity score is 54 out of 63.  She is divorced and lives alone, she has 1 grown son, age 55.  She has been told by multiple family members that she has pausing in her breathing and loud snoring.  She goes to bed currently around midnight and rise time is around 7:30 AM.  During the school year she will typically go to bed around 9:30 PM and rise time is at 5:45 AM.  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  She has multiple animals, mostly outside including goats and horses, she has 3 dogs in the home, typically they sleep in the bedroom on the floor.  She does have a TV on in her bedroom at night for noise and distraction primarily.  She has had weight fluctuation and was able to lose weight but gained most of it back and is currently scheduled to see a nutritionist.  She has seen ENT in the past and was told that she had a mildly deviated septum enlarged tonsils.  She denies night to night nocturia or recurrent morning or nocturnal headaches.  She is not aware of any family history of sleep apnea.  She drinks caffeine in the form of coffee, 2 cups in the mornings, rare alcohol, she is a  non-smoker.  Her Past Medical History Is Significant For: Past Medical History:  Diagnosis Date   Anxiety    Plantar fasciitis     Her Past Surgical History Is Significant For: Past Surgical History:  Procedure Laterality Date   ABLATION  02/03/2007   CERVICAL SPINE SURGERY  2013    Her Family History Is Significant For: Family History  Problem Relation Age of Onset   Ovarian cancer Other        Maternal Great Aunt   Sleep apnea Neg Hx     Her Social History Is Significant For: Social History   Socioeconomic History   Marital status: Divorced    Spouse name: Not on file   Number of children: Not on file   Years of education: Not on file   Highest education level: Not on file  Occupational History   Not on file  Tobacco Use   Smoking status: Never   Smokeless tobacco: Never  Substance and Sexual Activity   Alcohol use: Yes    Comment: rarely   Drug use: Never   Sexual activity: Not Currently    Birth control/protection: Surgical    Comment: ablation  Other Topics Concern   Not on file  Social History Narrative   Not on file   Social Determinants of Health   Financial Resource Strain: Not on file  Food Insecurity: Not on file  Transportation  Needs: Not on file  Physical Activity: Not on file  Stress: Not on file  Social Connections: Not on file    Her Allergies Are:  Allergies  Allergen Reactions   Amoxicillin Hives and Itching  :   Her Current Medications Are:  Outpatient Encounter Medications as of 06/07/2022  Medication Sig   albuterol (VENTOLIN HFA) 108 (90 Base) MCG/ACT inhaler Inhale 2 puffs into the lungs every 6 (six) hours as needed for wheezing or shortness of breath.   clonazePAM (KLONOPIN) 0.5 MG tablet Take 1 tablet (0.5 mg total) by mouth daily.   escitalopram (LEXAPRO) 10 MG tablet Take 1 tablet (10 mg total) by mouth daily.   mometasone (NASONEX) 50 MCG/ACT nasal spray PLACE 2 SPRAYS INTO THE NOSE DAILY.   Multiple Vitamin  (MULTIVITAMIN PO) Take by mouth daily.   VITAMIN D PO Take by mouth daily.   No facility-administered encounter medications on file as of 06/07/2022.  :   Review of Systems:  Out of a complete 14 point review of systems, all are reviewed and negative with the exception of these symptoms as listed below:   Review of Systems  Neurological:        Pt here for sleep consult  Pt snores ,fatigue pt denies hypertension,headaches ,sleep study,CPAP machine . Pt states she gasp for air during a night    ESS:14 FSS :54    Objective:  Neurological Exam  Physical Exam Physical Examination:   Vitals:   06/07/22 1107  BP: 112/79  Pulse: 61    General Examination: The patient is a very pleasant 61 y.o. female in no acute distress. She appears well-developed and well-nourished and well groomed.   HEENT: Normocephalic, atraumatic, pupils are equal, round and reactive to light, extraocular tracking is good without limitation to gaze excursion or nystagmus noted. Hearing is grossly intact. Face is symmetric with normal facial animation. Speech is clear with no dysarthria noted. There is no hypophonia. There is no lip, neck/head, jaw or voice tremor. Neck is supple with full range of passive and active motion. There are no carotid bruits on auscultation. Oropharynx exam reveals: mild mouth dryness, good dental hygiene and moderate airway crowding, due to tonsillar size of 3+, Mallampati class II, fairly prominent uvula.  She has a minimal overbite.  Tongue protrudes centrally and palate elevates symmetrically, neck circumference of 14-5/8 inches. Nasal inspection reveals mildly deviated septum to the right.  Chest: Clear to auscultation without wheezing, rhonchi or crackles noted.  Heart: S1+S2+0, regular and normal without murmurs, rubs or gallops noted.   Abdomen: Soft, non-tender and non-distended.  Extremities: There is no pitting edema in the distal lower extremities bilaterally.   Skin:  Warm and dry without trophic changes noted.   Musculoskeletal: exam reveals no obvious joint deformities.   Neurologically:  Mental status: The patient is awake, alert and oriented in all 4 spheres. Her immediate and remote memory, attention, language skills and fund of knowledge are appropriate. There is no evidence of aphasia, agnosia, apraxia or anomia. Speech is clear with normal prosody and enunciation. Thought process is linear. Mood is normal and affect is normal.  Cranial nerves II - XII are as described above under HEENT exam.  Motor exam: Normal bulk, strength and tone is noted. There is no tremor, Romberg is negative. Reflexes are 2+ throughout. Fine motor skills and coordination: grossly intact.  Cerebellar testing: No dysmetria or intention tremor. There is no truncal or gait ataxia.  Sensory exam: intact to  light touch in the upper and lower extremities.  Gait, station and balance: She stands easily. No veering to one side is noted. No leaning to one side is noted. Posture is age-appropriate and stance is narrow based. Gait shows normal stride length and normal pace. No problems turning are noted.   Assessment and Plan:  In summary, SONNIE BIAS is a very pleasant 61 y.o.-year old female with an underlying medical history of allergies, anxiety, depression, planter fasciitis and obesity, whose history and physical exam are concerning for sleep disordered breathing, supporting a current working diagnosis of unspecified sleep apnea, with the main differential diagnoses of obstructive sleep apnea (OSA) versus upper airway resistance syndrome (UARS) versus central sleep apnea (CSA), or mixed sleep apnea. A laboratory attended sleep study is considered gold standard for evaluation of sleep disordered breathing and is recommended at this time and clinically justified.   I had a long chat with the patient about my findings and the diagnosis of sleep apnea , particularly OSA, its prognosis  and treatment options. We talked about medical/conservative treatments, surgical interventions and non-pharmacological approaches for symptom control. I explained, in particular, the risks and ramifications of untreated moderate to severe OSA, especially with respect to developing cardiovascular disease down the road, including congestive heart failure (CHF), difficult to treat hypertension, cardiac arrhythmias (particularly A-fib), neurovascular complications including TIA, stroke and dementia. Even type 2 diabetes has, in part, been linked to untreated OSA. Symptoms of untreated OSA may include (but may not be limited to) daytime sleepiness, nocturia (i.e. frequent nighttime urination), memory problems, mood irritability and suboptimally controlled or worsening mood disorder such as depression and/or anxiety, lack of energy, lack of motivation, physical discomfort, as well as recurrent headaches, especially morning or nocturnal headaches. We talked about the importance of maintaining a healthy lifestyle and striving for healthy weight. In addition, we talked about the importance of striving for and maintaining good sleep hygiene. I recommended the following at this time: sleep study.  I outlined the differences between a laboratory attended sleep study which is considered more comprehensive and accurate over the option of a home sleep test (HST); the latter may lead to underestimation of sleep disordered breathing in some instances and does not help with diagnosing upper airway resistance syndrome and is not accurate enough to diagnose primary central sleep apnea typically. I explained the different sleep test procedures to the patient in detail and also outlined possible surgical and non-surgical treatment options of OSA, including the use of a pressure airway pressure (PAP) device (ie CPAP, AutoPAP/APAP or BiPAP in certain circumstances), a custom-made dental device (aka oral appliance, which would require a  referral to a specialist dentist or orthodontist typically, and is generally speaking not considered a good choice for patients with full dentures or edentulous state), upper airway surgical options, such as traditional UPPP (which is not considered a first-line treatment) or the Inspire device (hypoglossal nerve stimulator, which would involve a referral for consultation with an ENT surgeon, after careful selection, following inclusion criteria). I explained the PAP treatment option to the patient in detail, as this is generally considered first-line treatment.  The patient indicated that she would be willing to try PAP therapy, if the need arises. I explained the importance of being compliant with PAP treatment, not only for insurance purposes but primarily to improve patient's symptoms symptoms, and for the patient's long term health benefit, including to reduce Her cardiovascular risks longer-term.    We will pick up our  discussion about the next steps and treatment options after testing.  We will keep her posted as to the test results by phone call and/or MyChart messaging where possible.  We will plan to follow-up in sleep clinic accordingly as well.  I answered all her questions today and the patient was in agreement.   I encouraged her to call with any interim questions, concerns, problems or updates or email [place] through MyChart.  Generally speaking, sleep test authorizations may take up to 2 weeks, sometimes less, sometimes longer, the patient is encouraged to get in touch with [place] if they do not hear back from the sleep lab staff directly within the next 2 weeks.  Thank you very much for allowing me to participate in the care of this nice patient. If I can be of any further assistance to you please do not hesitate to call me at [phone].  Sincerely,   Huston Foley, MD, PhD

## 2022-06-07 NOTE — Patient Instructions (Signed)

## 2022-06-11 ENCOUNTER — Ambulatory Visit
Admission: RE | Admit: 2022-06-11 | Discharge: 2022-06-11 | Disposition: A | Discharge status: Home / Self Care | Payer: BC Managed Care – PPO | Source: Ambulatory Visit | Attending: Nurse Practitioner | Admitting: Nurse Practitioner

## 2022-06-11 ENCOUNTER — Telehealth: Payer: Self-pay

## 2022-06-11 DIAGNOSIS — Z1231 Encounter for screening mammogram for malignant neoplasm of breast: Secondary | ICD-10-CM

## 2022-06-11 NOTE — Telephone Encounter (Signed)
[employer]PS[employer]- [employer] state no auth req patient is scheduled at [facility] for 07/06/22 at 9 pm.  Mailed packet to the patient.

## 2022-06-11 NOTE — Telephone Encounter (Signed)
LVM for pt to call back to schedule sleep study.  

## 2022-06-12 NOTE — Telephone Encounter (Signed)
Patient call and wanted to r/s.  She is r/s for 07/02/22 at  8 pm. I mailed a new packet to the patient.

## 2022-06-13 LAB — SPECIMEN STATUS REPORT

## 2022-06-13 LAB — HGB A1C W/O EAG: Hgb A1c MFr Bld: 6 % — ABNORMAL HIGH (ref 4.8–5.6)

## 2022-06-14 ENCOUNTER — Ambulatory Visit (INDEPENDENT_AMBULATORY_CARE_PROVIDER_SITE_OTHER): Payer: BC Managed Care – PPO | Admitting: Family Medicine

## 2022-06-14 ENCOUNTER — Encounter: Payer: Self-pay | Admitting: Family Medicine

## 2022-06-14 VITALS — BP 100/72 | HR 66 | Temp 98.1°F | Resp 15 | Ht 65.0 in | Wt 195.0 lb

## 2022-06-14 DIAGNOSIS — F411 Generalized anxiety disorder: Secondary | ICD-10-CM | POA: Diagnosis not present

## 2022-06-14 DIAGNOSIS — R7303 Prediabetes: Secondary | ICD-10-CM

## 2022-06-14 DIAGNOSIS — F330 Major depressive disorder, recurrent, mild: Secondary | ICD-10-CM

## 2022-06-14 DIAGNOSIS — Z6832 Body mass index (BMI) 32.0-32.9, adult: Secondary | ICD-10-CM

## 2022-06-14 DIAGNOSIS — E782 Mixed hyperlipidemia: Secondary | ICD-10-CM | POA: Diagnosis not present

## 2022-06-14 DIAGNOSIS — E875 Hyperkalemia: Secondary | ICD-10-CM

## 2022-06-14 DIAGNOSIS — E6609 Other obesity due to excess calories: Secondary | ICD-10-CM

## 2022-06-14 MED ORDER — SEMAGLUTIDE-WEIGHT MANAGEMENT 0.5 MG/0.5ML ~~LOC~~ SOAJ: 0.5000 mg | SUBCUTANEOUS | 0 refills | Status: AC

## 2022-06-14 MED ORDER — SEMAGLUTIDE-WEIGHT MANAGEMENT 0.25 MG/0.5ML ~~LOC~~ SOAJ: 0.2500 mg | SUBCUTANEOUS | 0 refills | Status: AC

## 2022-06-14 NOTE — Progress Notes (Signed)
Subjective:  Patient ID: Lindsay Gregory, female    DOB: 09-24-1961  Age: 61 y.o. MRN: [id number]  Chief Complaint  Patient presents with   Hyperlipidemia   Depression   Prediabetes    HPI Anxiety: Patient is taking Klonopin 0.5 mg  daily as needed, Lexapro 10 mg daily. Apnea: Scheduled to have sleep study in sleep clinic towards the end of the month.  Patient had blood work on 06/06/2022 and they came back with elevated cholesterol and A1C 6.0% prediabetic range. Patient is speaking with dietician.  She also mentioned to feel fatigue. She wants to discuss the option for treatment.    Current Outpatient Medications on File Prior to Visit  Medication Sig Dispense Refill   clonazePAM (KLONOPIN) 0.5 MG tablet Take 1 tablet (0.5 mg total) by mouth daily. 30 tablet 1   escitalopram (LEXAPRO) 10 MG tablet Take 1 tablet (10 mg total) by mouth daily. 90 tablet 1   mometasone (NASONEX) 50 MCG/ACT nasal spray PLACE 2 SPRAYS INTO THE NOSE DAILY. 17 each 2   Multiple Vitamin (MULTIVITAMIN PO) Take by mouth daily.     VITAMIN D PO Take by mouth daily.     No current facility-administered medications on file prior to visit.   Past Medical History:  Diagnosis Date   Anxiety    Plantar fasciitis    Past Surgical History:  Procedure Laterality Date   ABLATION  02/03/2007   CERVICAL SPINE SURGERY  2013    Family History  Problem Relation Age of Onset   Ovarian cancer Other        Maternal Great Aunt   Sleep apnea Neg Hx    Breast cancer Neg Hx    Social History   Socioeconomic History   Marital status: Divorced    Spouse name: Not on file   Number of children: Not on file   Years of education: Not on file   Highest education level: Not on file  Occupational History   Not on file  Tobacco Use   Smoking status: Never   Smokeless tobacco: Never  Vaping Use   Vaping Use: Never used  Substance and Sexual Activity   Alcohol use: Yes    Comment: rarely   Drug use: Never    Sexual activity: Not Currently    Birth control/protection: Surgical    Comment: ablation  Other Topics Concern   Not on file  Social History Narrative   Not on file   Social Determinants of Health   Financial Resource Strain: Not on file  Food Insecurity: Not on file  Transportation Needs: Not on file  Physical Activity: Not on file  Stress: Not on file  Social Connections: Not on file    Review of Systems  Constitutional:  Negative for chills, fatigue and fever.  HENT:  Negative for congestion, ear pain and sore throat.   Respiratory:  Negative for cough and shortness of breath.   Cardiovascular:  Negative for chest pain and palpitations.  Gastrointestinal:  Negative for abdominal pain, constipation, diarrhea, nausea and vomiting.  Endocrine: Negative for polydipsia, polyphagia and polyuria.  Genitourinary:  Negative for difficulty urinating and dysuria.  Musculoskeletal:  Negative for arthralgias, back pain and myalgias.  Skin:  Negative for rash.  Neurological:  Negative for headaches.  Psychiatric/Behavioral:  Negative for dysphoric mood. The patient is not nervous/anxious.      Objective:  BP 100/72   Pulse 66   Temp 98.1 F (36.7 C)   Resp  15   Ht 5\' 5"  (1.651 m)   Wt 195 lb (88.5 kg)   SpO2 96%   BMI 32.45 kg/m      06/14/2022    3:06 PM 06/07/2022   11:07 AM 06/06/2022    9:17 AM  BP/Weight  Systolic BP 100 112 94  Diastolic BP 72 79 62  Wt. (Lbs) 195 196.8 195  BMI 32.45 kg/m2 32.75 kg/m2 31.96 kg/m2    Physical Exam Vitals reviewed.  Constitutional:      Appearance: Normal appearance. She is normal weight.  Cardiovascular:     Rate and Rhythm: Normal rate and regular rhythm.     Heart sounds: Normal heart sounds.  Pulmonary:     Effort: Pulmonary effort is normal. No respiratory distress.     Breath sounds: Normal breath sounds.  Abdominal:     General: Abdomen is flat. Bowel sounds are normal.     Palpations: Abdomen is soft.      Tenderness: There is no abdominal tenderness.  Neurological:     Mental Status: She is alert and oriented to person, place, and time.  Psychiatric:        Mood and Affect: Mood normal.        Behavior: Behavior normal.     Diabetic Foot Exam - Simple   No data filed      Lab Results  Component Value Date   WBC 5.6 06/06/2022   HGB 12.6 06/06/2022   HCT 38.0 06/06/2022   PLT 301 06/06/2022   GLUCOSE 96 06/14/2022   CHOL 214 (H) 06/06/2022   TRIG 99 06/06/2022   HDL 67 06/06/2022   LDLCALC 130 (H) 06/06/2022   ALT 13 06/14/2022   AST 17 06/14/2022   NA 141 06/14/2022   K 5.3 (H) 06/14/2022   CL 104 06/14/2022   CREATININE 1.01 (H) 06/14/2022   BUN 14 06/14/2022   CO2 21 06/14/2022   TSH 1.480 06/06/2022   HGBA1C 6.0 (H) 06/06/2022      Assessment & Plan:   Problem List Items Addressed This Visit       Other   GAD (generalized anxiety disorder) - Primary    The current medical regimen is effective;  continue present plan and medications.       Depression, major, recurrent, mild (HCC)    The current medical regimen is effective;  continue present plan and medications.       Prediabetes    Recommend continue to work on eating healthy diet and exercise. Start on wegovy        Mixed hyperlipidemia    uncontrolled Continue to work on eating a healthy diet and exercise.  Labs reviewed.       Hyperkalemia    Check cmp.       Relevant Orders   Comprehensive metabolic panel (Completed)   Class 1 obesity due to excess calories without serious comorbidity with body mass index (BMI) of 32.0 to 32.9 in adult    Recommend continue to work on eating healthy diet and exercise. Start wegovy       Relevant Medications   Semaglutide-Weight Management 0.25 MG/0.5ML SOAJ   Semaglutide-Weight Management 0.5 MG/0.5ML SOAJ (Start on 07/13/2022)  .  Meds ordered this encounter  Medications   Semaglutide-Weight Management 0.25 MG/0.5ML SOAJ    Sig: Inject 0.25  mg into the skin once a week for 28 days.    Dispense:  2 mL    Refill:  0  FILL FIRST!   Semaglutide-Weight Management 0.5 MG/0.5ML SOAJ    Sig: Inject 0.5 mg into the skin once a week for 28 days.    Dispense:  2 mL    Refill:  0    FILL SECOND    Orders Placed This Encounter  Procedures   Comprehensive metabolic panel     Follow-up: Return in about 6 weeks (around 07/26/2022).  An After Visit Summary was printed and given to the patient.  Blane Ohara, MD [facility] [phone]

## 2022-06-15 LAB — COMPREHENSIVE METABOLIC PANEL
ALT: 13 IU/L (ref 0–32)
AST: 17 IU/L (ref 0–40)
Albumin/Globulin Ratio: 2 (ref 1.2–2.2)
Albumin: 4.9 g/dL (ref 3.8–4.9)
Alkaline Phosphatase: 77 IU/L (ref 44–121)
BUN/Creatinine Ratio: 14 (ref 9–23)
BUN: 14 mg/dL (ref 6–24)
Bilirubin Total: 0.2 mg/dL (ref 0.0–1.2)
CO2: 21 mmol/L (ref 20–29)
Calcium: 9.9 mg/dL (ref 8.7–10.2)
Chloride: 104 mmol/L (ref 96–106)
Creatinine, Ser: 1.01 mg/dL — ABNORMAL HIGH (ref 0.57–1.00)
Globulin, Total: 2.4 g/dL (ref 1.5–4.5)
Glucose: 96 mg/dL (ref 70–99)
Potassium: 5.3 mmol/L — ABNORMAL HIGH (ref 3.5–5.2)
Sodium: 141 mmol/L (ref 134–144)
Total Protein: 7.3 g/dL (ref 6.0–8.5)
eGFR: 64 mL/min/{1.73_m2} (ref >59)

## 2022-06-17 DIAGNOSIS — E875 Hyperkalemia: Secondary | ICD-10-CM | POA: Insufficient documentation

## 2022-06-17 DIAGNOSIS — E6609 Other obesity due to excess calories: Secondary | ICD-10-CM | POA: Insufficient documentation

## 2022-06-17 NOTE — Assessment & Plan Note (Signed)
The current medical regimen is effective;  continue present plan and medications.  

## 2022-06-17 NOTE — Assessment & Plan Note (Signed)
uncontrolled Continue to work on eating a healthy diet and exercise.  Labs reviewed.

## 2022-06-17 NOTE — Assessment & Plan Note (Signed)
Check cmp 

## 2022-06-17 NOTE — Assessment & Plan Note (Signed)
Recommend continue to work on eating healthy diet and exercise. Start wegovy.    

## 2022-06-17 NOTE — Assessment & Plan Note (Signed)
Recommend continue to work on eating healthy diet and exercise. Start on [employer]

## 2022-07-02 ENCOUNTER — Ambulatory Visit (INDEPENDENT_AMBULATORY_CARE_PROVIDER_SITE_OTHER): Payer: BC Managed Care – PPO | Admitting: Neurology

## 2022-07-02 DIAGNOSIS — J351 Hypertrophy of tonsils: Secondary | ICD-10-CM

## 2022-07-02 DIAGNOSIS — R0681 Apnea, not elsewhere classified: Secondary | ICD-10-CM

## 2022-07-02 DIAGNOSIS — G4720 Circadian rhythm sleep disorder, unspecified type: Secondary | ICD-10-CM

## 2022-07-02 DIAGNOSIS — E669 Obesity, unspecified: Secondary | ICD-10-CM

## 2022-07-02 DIAGNOSIS — G4730 Sleep apnea, unspecified: Secondary | ICD-10-CM

## 2022-07-02 DIAGNOSIS — R0683 Snoring: Secondary | ICD-10-CM

## 2022-07-02 DIAGNOSIS — G4719 Other hypersomnia: Secondary | ICD-10-CM

## 2022-07-02 DIAGNOSIS — G4733 Obstructive sleep apnea (adult) (pediatric): Secondary | ICD-10-CM

## 2022-07-02 DIAGNOSIS — E66811 Obesity, class 1: Secondary | ICD-10-CM

## 2022-07-11 NOTE — Procedures (Signed)
[facility] POLYSOMNOGRAPHY  INTERPRETATION REPORT   STUDY DATE:  07/02/2022     PATIENT NAME:  Lindsay Gregory         DATE OF BIRTH:  [birth year]  PATIENT ID:  [id number]    TYPE OF STUDY:  PSG  READING PHYSICIAN: Huston Foley, MD, PhD SCORING TECHNICIAN: [place]  Referring provider: Flonnie Hailstone, NP  INDICATIONS: 61 year old right-handed woman with an underlying medical history of allergies, anxiety, depression, planter fasciitis and obesity, who reports snoring and excessive daytime sleepiness as well as witnessed apneas. The Epworth Sleepiness Scale was 14 out of 24 (scores above or equal to 10 are suggestive of hypersomnolence).  ADDITIONAL INFORMATION:  Height: 65 in Weight: 196 lb (BMI 32) Neck Size: 15 in Medications: Ventolin, Klonopin, Lexapro, Nasonex, Multivitamin DESCRIPTION: A sleep technologist was in attendance for the duration of the recording.  Data collection, scoring, video monitoring, and reporting were performed in compliance with the AASM Manual for the Scoring of Sleep and Associated Events; (Hypopnea is scored based on the criteria listed in Section VIII D. 1b in the AASM Manual V2.6 using a 4% oxygen desaturation rule or Hypopnea is scored based on the criteria listed in Section VIII D. 1a in the AASM Manual V2.6 using 3% oxygen desaturation and /or arousal rule).  A physician certified by the American Board of Sleep Medicine reviewed each epoch of the study.  FINDINGS:  Please refer to the attached summary for additional quantitative information.  SLEEP CONTINUITY AND SLEEP ARCHITECTURE:  Lights out was at 20:42: and lights on 05:00: (8.3 hours in bed). Total sleep time was 385.0 minutes (40.6% supine;  59.4% lateral;  0% prone, 7.0% REM sleep), with a decreased sleep efficiency at 77.3%. Sleep latency was increased at 32.0 minutes.  REM sleep latency was markedly increased at 337.0 minutes. Of the total sleep time, the  percentage of stage N1 sleep was 3.0%, stage N2 sleep was 80%, which is markedly increased, stage N3 sleep was 10.1%, and REM sleep was 7.0%, which is markedly reduced. There were 2 Stage R periods observed on this study night, 26 awakenings (i.e. transitions to Stage W from any sleep stage), and 78 total stage transitions. Wake after sleep onset (WASO) time accounted for 81 minutes, with mild to moderate sleep fragmentation noted.  AROUSAL: There were 95 arousals in total, for an arousal index of 15 arousals/hour.  Of these, 79 were identified as respiratory-related arousals (12 /hr), 0 were PLM-related arousals (0 /hr), and 36 were non-specific arousals (6 /hr)  RESPIRATORY MONITORING:  Based on CMS criteria (using a 4% oxygen desaturation rule for scoring hypopneas), there were 185 apneas (185 obstructive; 0 central; 0 mixed), and 56 hypopneas.  Apnea index was 28.8. Hypopnea index was 8.7. The apnea-hypopnea index was 37.6 overall (67.9 supine, 16 non-supine; 15.6 REM, 0.0 supine REM). There were 0 respiratory effort-related arousals (RERAs).  The RERA index was 0 events/hr. Total respiratory disturbance index (RDI) was 37.6 events/hr. RDI results showed: supine RDI  67.9 /hr; non-supine RDI 16.8 /hr; REM RDI 15.6 /hr, supine REM RDI 0.0 /hr.   Based on AASM criteria (using a 3% oxygen desaturation and /or arousal rule for scoring hypopneas), there were 185 apneas (185 obstructive; 0 central; 0 mixed), and 63 hypopneas. Apnea index was 28.8. Hypopnea index was 9.8. The apnea-hypopnea index was 38.6/hour overall (68.2 supine, 22 non-supine; 22.2 REM, 0.0 supine REM). There were 0 respiratory effort-related arousals (RERAs).  The RERA index  was 0 events/hr. Total respiratory disturbance index (RDI) was 38.6 events/hr. RDI results showed: supine RDI  68.2 /hr; non-supine RDI 18.4 /hr; REM RDI 22.2 /hr, supine REM RDI 0.0 /hr.  Respiratory events were associated with oxyhemoglobin desaturations (nadir during  sleep 76%) from a mean of 94%). There were 0 occurrences of Cheyne Stokes breathing.   OXIMETRY: Total sleep time spent at, or below 88% was 42.5 minutes, or 11.0% of total sleep time. Snoring was classified as mild to moderate. BODY POSITION: Duration of total sleep and percent of total sleep in their respective position is as follows: supine 156 minutes (41%), non-supine 229 minutes (59%); right 00 minutes (0%), left 228 minutes (59%), and prone 00 minutes (0%). Total supine REM sleep time was 00 minutes (0% of total REM sleep).  LIMB MOVEMENTS: There were 30 periodic limb movements of sleep (4.7/hr), of which 0 (0.0/hr) were associated with an arousal.  EEG: With the limited montage recorded, no EEG abnormalities were observed. CARDIAC: The electrocardiogram showed normal sinus rhythm.  The average heart rate during sleep was 57 bpm.  The maximum heart rate during sleep was 109 bpm. The maximum heart rate during recording was 109.  BEHAVIORAL: No significant parasomnia behavior noted.The video and audio analysis did not show any abnormal or unusual behaviors, movements, phonations or vocalizations. Post study, the patient indicated, that sleep was the same as usual. IMPRESSION and DIAGNOSES:   1. Obstructive sleep apnea (OSA)  2. 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 This study demonstrates severe obstructive sleep apnea, with a total AHI of 38.6/hour, REM AHI of 22.2/hour, supine AHI of 68.6/hour and O2 nadir of 76%. Treatment with positive airway pressure in the form of CPAP is recommended. This will require a full night titration study to optimize therapy settings, mask fit, monitoring of tolerance and of proper oxygen saturation. Other treatment options may be limited, and may include (generally speaking) surgical options in selected patients or the use of an oral appliance in certain patients. Concomitant weight loss is recommended. Please note  that untreated obstructive sleep apnea may carry additional perioperative morbidity. Patients with significant obstructive sleep apnea should receive perioperative PAP therapy and the surgeons and particularly the anesthesiologist should be informed of the diagnosis and the severity of the sleep disordered breathing. 2. This study shows sleep fragmentation and abnormal sleep stage percentages; these are nonspecific findings and, per se, do not signify an intrinsic sleep disorder or a cause for the patient's sleep-related symptoms. Causes include (but are not limited to) the first night effect of the sleep study, circadian rhythm disturbances, medication effect or an underlying mood disorder or medical problem.  3. The patient should be cautioned not to drive, work at heights, or operate dangerous or heavy equipment when tired or sleepy. Review and reiteration of good sleep hygiene measures should be pursued with any patient. 4. The patient will be seen in follow-up in the sleep clinic at [facility] for discussion of the test results, symptom and treatment compliance review, further management strategies, etc. The patient and her referring provider will be notified of the test results.  I certify that I have reviewed the entire raw data recording prior to the issuance of this report in accordance with the Standards of Accreditation of the American Academy of Sleep Medicine (AASM).      Huston Foley,  MD, PhD

## 2022-07-11 NOTE — Addendum Note (Signed)
Addended by: Huston Foley on: 07/11/2022 01:30 PM   Modules accepted: Orders

## 2022-07-17 ENCOUNTER — Telehealth: Payer: Self-pay

## 2022-07-17 NOTE — Telephone Encounter (Signed)
I called patient to discuss. No answer, says number is not in service. Will send patient a mychart message.

## 2022-07-17 NOTE — Telephone Encounter (Signed)
-----   Message from Huston Foley, MD sent at 07/11/2022  1:30 PM EDT ----- Patient referred by Flonnie Hailstone, NP, seen by me on 06/07/22, diagnostic PSG on 07/02/22.   Please call and notify the patient that the recent sleep study showed severe obstructive sleep apnea. I recommend treatment for this in the form of CPAP. This will require a repeat sleep study for proper titration and mask fitting and correct monitoring of the oxygen saturations. Please explain to patient. I have placed an order in the chart. Thanks.  Huston Foley, MD, PhD [facility])

## 2022-07-19 NOTE — Telephone Encounter (Signed)
Called pt & LVM (ok per DPR) advising patient of sleep study results. I let her know that our office would do the insurance authorization for the sleep study and then call her to schedule. Left office number for call back if needed.

## 2022-07-23 ENCOUNTER — Telehealth: Payer: BC Managed Care – PPO | Admitting: Family Medicine

## 2022-07-23 ENCOUNTER — Other Ambulatory Visit: Payer: Self-pay

## 2022-07-23 MED ORDER — MOMETASONE FUROATE 50 MCG/ACT NA SUSP: 2.0000 | Freq: Every day | NASAL | 2 refills | Status: DC

## 2022-07-26 ENCOUNTER — Telehealth: Payer: Self-pay | Admitting: Neurology

## 2022-07-26 ENCOUNTER — Telehealth: Payer: BC Managed Care – PPO | Admitting: Family Medicine

## 2022-07-26 NOTE — Telephone Encounter (Signed)
LVM for pt to call back to schedule  [employer] state no auth req  

## 2022-07-31 NOTE — Telephone Encounter (Signed)
LVM for pt to call back I also inform her in the message that I sent her a mychart message  and that may be an easier way to communicate easier for her work schedule

## 2022-08-24 ENCOUNTER — Ambulatory Visit (INDEPENDENT_AMBULATORY_CARE_PROVIDER_SITE_OTHER): Payer: BC Managed Care – PPO | Admitting: Neurology

## 2022-08-24 DIAGNOSIS — G4720 Circadian rhythm sleep disorder, unspecified type: Secondary | ICD-10-CM

## 2022-08-24 DIAGNOSIS — G4733 Obstructive sleep apnea (adult) (pediatric): Secondary | ICD-10-CM

## 2022-08-24 DIAGNOSIS — E669 Obesity, unspecified: Secondary | ICD-10-CM

## 2022-08-24 DIAGNOSIS — J351 Hypertrophy of tonsils: Secondary | ICD-10-CM

## 2022-08-24 DIAGNOSIS — G4719 Other hypersomnia: Secondary | ICD-10-CM

## 2022-08-30 NOTE — Procedures (Signed)
Physician Interpretation:     [facility] PAP TITRATION INTERPRETATION REPORT   STUDY DATE: 08/24/2022      PATIENT NAME:  Lindsay Gregory         DATE OF BIRTH:  01-24-1961  PATIENT ID:  [id number]    TYPE OF STUDY:  PSG  READING PHYSICIAN: Star Age, MD, PhD SCORING TECHNICIAN: Gaylyn Cheers, RPSGT   Referring provider: Jerrell Belfast, NP HISTORY: 61 year old right-handed woman with an underlying medical history of allergies, anxiety, depression, planter fasciitis and obesity, who presents for a full night titration study to treat her sleep apnea. Her baseline sleep study from 07/02/2022 showed severe obstructive sleep apnea, with a total AHI of 38.6/hour, REM AHI of 22.2/hour, supine AHI of 68.6/hour and O2 nadir of 76%. The Epworth Sleepiness Scale was 14 out of 24 (scores above or equal to 10 are suggestive of hypersomnolence). Height: 65.0 in Weight: 196 lb (BMI 32) Neck Size: 14.6 in    MEDICATIONS: Ventolin, Klonopin, Lexapro, Nasonex, Multivitamin  DESCRIPTION: A sleep technologist was in attendance for the duration of the recording.  Data collection, scoring, video monitoring, and reporting were performed in compliance with the AASM Manual for the Scoring of Sleep and Associated Events; (Hypopnea is scored based on the criteria listed in Section VIII D. 1b in the AASM Manual V2.6 using a 4% oxygen desaturation rule or Hypopnea is scored based on the criteria listed in Section VIII D. 1a in the AASM Manual V2.6 using 3% oxygen desaturation and /or arousal rule).  A physician certified by the American Board of Sleep Medicine reviewed each epoch of the study.   SLEEP CONTINUITY AND SLEEP ARCHITECTURE:  Lights off was at 22:07: and lights on 05:06: (7.0 hours in bed). Total sleep time was 336.0 minutes (100.0% supine;  0.0% lateral;  0.0% prone, 7.0% REM sleep), with a decreased sleep efficiency at 80.1%. Sleep latency was normal at 9.5 minutes.REM  latency was markedly delayed at 4 hours and 37.5 min.  Of the total sleep time, the percentage of stage N1 sleep was 8.5%, stage N2 sleep was 75.6%, which is markedly increased, stage N3 sleep was 8.9%, and REM sleep was 7.0%, which is reduced. Wake after sleep onset (WASO) time accounted for 74 minutes with mild to moderate sleep fragmentation noted.  AROUSAL: There were 50 arousals in total, for an arousal index of 8.9 arousals/hour.  Of these, 14 were identified as respiratory-related arousals (2.5 /h), 0 were PLM-related arousals (0.0 /h), and 54 were non-specific arousals (9.6 /h)  RESPIRATORY MONITORING:  Based on CMS criteria (using a 4% oxygen desaturation rule for scoring hypopneas), there were 31 apneas (5 obstructive; 23 central; 3 mixed), and 7 hypopneas.  Apnea index was 5.5. Hypopnea index was 1.3. The apnea-hypopnea index was 6.8 overall (6.8 supine, 0.0 non-supine; 0.0 REM, 0.0 supine REM). There were 0 respiratory effort-related arousals (RERAs).  The RERA index was 0.0 events/h. Total respiratory disturbance index (RDI) was 6.8 events/h. RDI results showed: supine RDI  6.8 /h; non-supine RDI 0.0 /h; REM RDI 0.0 /h, supine REM RDI 0.0 /h.   Based on AASM criteria (using a 3% oxygen desaturation and /or arousal rule for scoring hypopneas), there were 31 apneas (5 obstructive; 23 central; 3 mixed), and 16 hypopneas. Apnea index was 5.5. Hypopnea index was 2.9. The apnea-hypopnea index was 8.4 overall (8.4 supine, 0.0 non-supine; 2.6 REM, 2.6 supine REM). There were 0 respiratory effort-related arousals (RERAs).  The RERA index was  0.0 events/h. Total respiratory disturbance index (RDI) was 8.4 events/h. RDI results showed: supine RDI  8.4 /h; non-supine RDI 0.0 /h; REM RDI 2.6 /h, supine REM RDI 2.6 /h.  Respiratory events were associated with oxyhemoglobin desaturations (nadir during sleep 84%) from a mean of 94%). There were 0 occurrences of Cheyne Stokes breathing. OXIMETRY: Total sleep time  spent at, or below 88% was 0.8 minutes, or 0.2% of total sleep time. Snoring was classified as . BODY POSITION: Duration of total sleep and percent of total sleep in their respective position is as follows: supine 336 minutes (100.0%), non-supine 0.0 minutes (0.0%); right 00 minutes (0.0%), left 00 minutes (0.0%), and prone 00 minutes (0.0%). Total supine REM sleep time was 23 minutes (100.0% of total REM sleep). LIMB MOVEMENTS: There were 0 periodic limb movements of sleep (0.0/h), of which 0 (0.0/h) were associated with an arousal.  TITRATION DETAILS (SEE ALSO TABLE AT THE END OF THE REPORT):  The patient was shown several different interfaces and was subsequently fitted with a medium Eson 2 nasal mask from [employer].  The patient was started on a pressure of 5 cm of water pressure and gradually titrated to a final titration pressure of 8 cm, at which time the patient achieved a total sleep time of 87.5 minutes.  Residual AHI was 8.23/h, O2 nadir of 89%, with brief supine REM sleep achieved.  EPR of 2 was added for comfort and tolerance.   EEG: Review of the EEG showed no abnormal electrical discharges and symmetrical bihemispheric findings.     EKG: The EKG revealed normal sinus rhythm (NSR). The average heart rate during sleep was 61 bpm. The maximum heart rate during sleep was 77 bpm.    AUDIO/VIDEO REVIEW: The audio and video review did not show any abnormal or unusual behaviors, movements, phonations or vocalizations. The patient took no restroom visits. Snoring was significantly improved with CPAP therapy.    POST-STUDY QUESTIONNAIRE: Post study, the patient indicated, that sleep was the same as usual. The patient stated CPAP was fine.  She had to readjust for mouth breathing.  She appreciated the sleep technician, reported that he was very professional and nice, made her comfortable and had a great sense of humor.   IMPRESSION:    1. Obstructive sleep apnea (OSA) 2. Dysfunctions  associated with sleep stages or arousal from sleep   RECOMMENDATIONS:    1. This study demonstrates significant improvement of the patient's obstructive sleep apnea with CPAP therapy. Given her mild residual sleep apnea on a pressure if 8 cm, I will recommend a home CPAP treatment pressure of 9 cm via medium Eson 2 nasal mask with heated humidity. The patient should be reminded to be fully compliant with PAP therapy to improve sleep related symptoms and decrease long term cardiovascular risks. The patient should be reminded, that it may take up to 3 months to get fully used to using PAP with all planned sleep. The earlier full compliance is achieved, the better long term compliance tends to be.  2. Other treatment options may be limited, and may include (generally speaking) surgical options in selected patients or the use of an oral appliance in certain patients. Concomitant weight loss is recommended. Please note that untreated obstructive sleep apnea may carry additional perioperative morbidity. Patients with significant obstructive sleep apnea should receive perioperative PAP therapy and the surgeons and particularly the anesthesiologist should be informed of the diagnosis and the severity of the sleep disordered breathing. 2. This study  shows sleep fragmentation and abnormal sleep stage percentages; these are nonspecific findings and, per se, do not signify an intrinsic sleep disorder or a cause for the patient's sleep-related symptoms. Causes include (but are not limited to) the first night effect of the sleep study, circadian rhythm disturbances, medication effect or an underlying mood disorder or medical problem.  3. The patient should be cautioned not to drive, work at heights, or operate dangerous or heavy equipment when tired or sleepy. Review and reiteration of good sleep hygiene measures should be pursued with any patient. 4. The patient will be seen in follow-up in the sleep clinic at [facility] for  discussion of the test results, symptom and treatment compliance review, further management strategies, etc. The patient and her referring provider will be notified of the test results.   I certify that I have reviewed the entire raw data recording prior to the issuance of this report in accordance with the Standards of Accreditation of the American Academy of Sleep Medicine (AASM).   Huston Foley,  MD, PhD [facility]) Diplomat, ABPN (Neurology and Sleep)              Technical Report:   [facility] CPAP Summary    General Information  Name: Lamees, Gable BMI: 32.62 Physician: Huston Foley, MD  ID: [id number] Height: 65.0 in Technician: Domingo Cocking, RPSGT  Sex: Female Weight: 196.0 lb Record: [id number]  Age: 62 [09/04/1961] Date: 08/24/2022     Medical & Medication History    61 year old right-handed woman with an underlying medical history of allergies, anxiety, depression, planter fasciitis and obesity, who reports snoring and excessive daytime sleepiness as well as witnessed apneas. The Epworth Sleepiness Scale was 14 out of 24. Diagnostic polysomnogram performed on 07/02/2022 revealed: severe obstructive sleep apnea, with a total AHI of 38.6/hour, REM AHI of 22.2/hour, supine AHI of 68.6/hour and O2 nadir of 76%.  Ventolin, Klonopin, Lexapro, Nasonex, Multivitamin   Sleep Disorder      Comments   Patient arrived for a CPAP titration polysomnogram. Procedure explained and all questions answered. Patient was shown CPAP at 5cm/H20 using a medium P-10 nasal pillows mask, a medium Eson 2 nasal mask and a small Vitera full face mask prior to setup. Standard paste setup without complications. CPAP was started at 5cm using the patient mask of choice, the medium Eson 2 nasal mask. EPR at 2 was used for patient comfort/promote sleep. Patient slept left, right, and supine. No snoring was heard. Respiratory events  observed. CPAP was increased to 8cm (with EPR = 2) in an effort to control obstructive respiratory events and abolish snoring. No obvious cardiac arrhythmias observed. No significant PLMS observed. Norestroom visit.    CPAP start time: 10:07:10 PM CPAP end time: 05:06:55 AM   Time Total Supine Side Prone Upright  Recording (TRT) 6h 59.51m 6h 59.76m 0h 0.65m 0h 0.62m 0h 0.71m  Sleep (TST) 5h 36.3m 5h 36.10m 0h 0.79m 0h 0.24m 0h 0.36m   Latency N1 N2 N3 REM Onset Per. Slp. Eff.  Actual 0h 0.70m 0h 4.71m 0h 23.60m 4h 37.31m 0h 9.81m 0h 15.41m 80.10%   Stg Dur Wake N1 N2 N3 REM  Total 83.5 28.5 254.0 30.0 23.5  Supine 83.5 28.5 254.0 30.0 23.5  Side 0.0 0.0 0.0 0.0 0.0  Prone 0.0 0.0 0.0 0.0 0.0  Upright 0.0 0.0 0.0 0.0 0.0   Stg % Wake N1 N2 N3 REM  Total 19.9 8.5 75.6 8.9 7.0  Supine 19.9 8.5 75.6 8.9 7.0  Side 0.0 0.0 0.0 0.0 0.0  Prone 0.0 0.0 0.0 0.0 0.0  Upright 0.0 0.0 0.0 0.0 0.0     Apnea Summary Sub Supine Side Prone Upright  Total 31 Total 31 31 0 0 0    REM 0 0 0 0 0    NREM 31 31 0 0 0  Obs 5 REM 0 0 0 0 0    NREM 5 5 0 0 0  Mix 3 REM 0 0 0 0 0    NREM 3 3 0 0 0  Cen 23 REM 0 0 0 0 0    NREM 23 23 0 0 0   Rera Summary Sub Supine Side Prone Upright  Total 0 Total 0 0 0 0 0    REM 0 0 0 0 0    NREM 0 0 0 0 0   Hypopnea Summary Sub Supine Side Prone Upright  Total 16 Total 16 16 0 0 0    REM 1 1 0 0 0    NREM 15 15 0 0 0   4% Hypopnea Summary Sub Supine Side Prone Upright  Total (4%) 7 Total 7 7 0 0 0    REM 0 0 0 0 0    NREM 7 7 0 0 0     AHI Total Obs Mix Cen  8.39 Apnea 5.54 0.89 0.54 4.11   Hypopnea 2.86 -- -- --  6.79 Hypopnea (4%) 1.25 -- -- --    Total Supine Side Prone Upright  Position AHI 8.39 8.39 0.00 0.00 0.00  REM AHI 2.55   NREM AHI 8.83   Position RDI 8.39 8.39 0.00 0.00 0.00  REM RDI 2.55   NREM RDI 8.83    4% Hypopnea Total Supine Side Prone Upright  Position AHI (4%) 6.79 6.79 0.00 0.00 0.00  REM AHI (4%) 0.00   NREM AHI (4%) 7.30    Position RDI (4%) 6.79 6.79 0.00 0.00 0.00  REM RDI (4%) 0.00   NREM RDI (4%) 7.30    Desaturation Information  <100% <90% <80% <70% <60% <50% <40%  Supine 69 8 0 0 0 0 0  Side 0 0 0 0 0 0 0  Prone 0 0 0 0 0 0 0  Upright 0 0 0 0 0 0 0  Total 69 8 0 0 0 0 0  Desaturation threshold setting: 3% Minimum desaturation setting: 10 seconds SaO2 nadir: 75% The longest event was a 25 sec central Apnea with a minimum SaO2 of 84%. The lowest SaO2 was 84% associated with a 25 sec central Apnea. EKG Rates EKG Avg Max Min  Awake 61 78 50  Asleep 61 77 49  EKG Events: N/A Awakening/Arousal Information # of Awakenings 33  Wake after sleep onset 74.48m  Wake after persistent sleep 71.106m   Arousal Assoc. Arousals Index  Apneas 12 2.1  Hypopneas 2 0.4  Leg Movements 2 0.4  Snore 0.0 0.0  PTT Arousals 0 0.0  Spontaneous 54 9.6  Total 70 12.5  Myoclonus Information PLMS LMs Index  Total LMs during PLMS 0 0.0  LMs w/ Microarousals 0 0.0   LM LMs Index  w/ Microarousal 2 0.4  w/ Awakening 2 0.4  w/ Resp Event 0 0.0  Spontaneous 3 0.5  Total 5 0.9      Titration Table:  [facility] CPAP/Bilevel Report    General Information  Name: Venissa, Nappi  BMI: 32 Physician: Huston Foley, MD  ID: [id number] Height: 65 in Technician: Domingo Cocking  Sex: Female Weight: 196 lb Record: [id number]  Age: 82 [19-Dec-1960] Date: 08/24/2022 Scorer: Domingo Cocking   Recommended Settings IPAP: N/A cmH20 EPAP: N/A cmH2O AHI: N/A AHI (4%): N/A   Pressure IPAP/EPAP 00 05 06 07 08   O2 Vol 0.0 0.0 0.0 0.0 0.0  Time TRT 0.70m 97.25m 54.56m 149.68m 119.65m   TST 0.76m 83.66m 53.65m 112.75m 87.68m  Sleep Stage % Wake 0.0 14.4 1.9 24.8 26.5   % REM 0.0 0.0 0.0 12.1 11.4   % N1 0.0 5.4 0.0 8.9 16.0   % N2 0.0 58.7 100.0 79.0 72.6   % N3 0.0 35.9 0.0 0.0 0.0  Respiratory Total Events 0 7 3 25 12    Obs. Apn. 0 4 0 1 0   Mixed Apn. 0 0 1 2 0   Cen. Apn. 0 1 1 11 10     Hypopneas 0 2 1 11 2    AHI 0.00 5.03 3.40 13.39 8.23   Supine AHI 0.00 5.03 3.40 13.39 8.23   Prone AHI 0.00 0.00 0.00 0.00 0.00   Side AHI 0.00 0.00 0.00 0.00 0.00  Respiratory (4%) Hypopneas (4%) 0.00 1.00 1.00 3.00 2.00   AHI (4%) 0.00 4.31 3.40 9.11 8.23   Supine AHI (4%) 0.00 4.31 3.40 9.11 8.23   Prone AHI (4%) 0.00 0.00 0.00 0.00 0.00   Side AHI (4%) 0.00 0.00 0.00 0.00 0.00  Desat Profile <= 90% 0.69m 0.53m 0.23m 1.2m 0.26m   <= 80% 0.51m 0.44m 0.19m 0.62m 0.46m   <= 70% 0.10m 0.7m 0.55m 0.50m 0.32m   <= 60% 0.29m 0.38m 0.31m 0.90m 0.37m  Arousal Index Apnea 0.0 2.2 0.0 2.7 2.7   Hypopnea 0.0 0.7 0.0 0.5 0.0   LM 0.0 0.7 0.0 0.5 0.0   Spontaneous 0.0 7.2 6.8 10.7 12.3

## 2022-08-30 NOTE — Addendum Note (Signed)
Addended by: Star Age on: 08/30/2022 05:43 PM   Modules accepted: Orders

## 2022-09-03 ENCOUNTER — Telehealth: Payer: Self-pay

## 2022-09-03 NOTE — Telephone Encounter (Signed)
-----   Message from Star Age, MD sent at 08/30/2022  5:43 PM EDT ----- Patient referred by Jerrell Belfast, NP, seen by me on 06/07/22, diagnostic PSG on 07/02/22.  Patient had a CPAP titration study on 08/24/22.  Please call and inform patient that I have entered an order for treatment with positive airway pressure (PAP) treatment for obstructive sleep apnea (OSA). She did well during the latest sleep study with CPAP. We will, therefore, arrange for a machine for home use through a DME (durable medical equipment) company of Her choice; and I will see the patient back in follow-up in about 10 weeks. Please also explain to the patient that I will be looking out for compliance data, which can be downloaded from the machine (stored on an SD card, that is inserted in the machine) or via remote access through a modem, that is built into the machine. At the time of the followup appointment we will discuss sleep study results and how it is going with PAP treatment at home. Please advise patient to bring Her machine at the time of the first FU visit, even though this is cumbersome. Bringing the machine for every visit after that will likely not be needed, but often helps for the first visit to troubleshoot if needed. Please re-enforce the importance of compliance with treatment and the need for [place] to monitor compliance data - often an insurance requirement and actually good feedback for the patient as far as how they are doing.  Also remind patient, that any interim PAP machine or mask issues should be first addressed with the DME company, as they can often help better with technical and mask fit issues. Please ask if patient has a preference regarding DME company.  Please also make sure, the patient has a follow-up appointment with me in about 10 weeks from the setup date, thanks. May see one of our nurse practitioners if needed for proper timing of the FU appointment.  Please fax or rout report to the referring  provider. Thanks,   Star Age, MD, PhD [facility])

## 2022-09-03 NOTE — Telephone Encounter (Signed)
I called patient to discuss. No answer, left a message asking her to call me back. ?

## 2022-09-03 NOTE — Telephone Encounter (Signed)
Pt is returning a call from nurse. Requesting a call back.  

## 2022-09-03 NOTE — Telephone Encounter (Signed)
I called pt. I advised pt that Dr. Rexene Alberts reviewed their sleep study results and found that pt did well during her latest sleep study. Dr. Rexene Alberts recommends that pt start a CPAP at home. I reviewed PAP compliance expectations with the pt. Pt is agreeable to starting a CPAP. I advised pt that an order will be sent to a DME, [facility], and [facility] will call the pt within about one week after they file with the pt's insurance. [facility] will show the pt how to use the machine, fit for masks, and troubleshoot the CPAP if needed. A follow up appt was made for insurance purposes with Janett Billow, NP on 12/12/22 at 3:45pm. Pt verbalized understanding to arrive 15 minutes early and bring their CPAP. A letter with all of this information in it will be mailed to the pt as a reminder. I verified with the pt that the address we have on file is correct. Pt verbalized understanding of results. Pt had no questions at this time but was encouraged to call back if questions arise. I have sent the order to [facility] and have received confirmation that they have received the order.

## 2022-09-17 ENCOUNTER — Other Ambulatory Visit: Payer: Self-pay

## 2022-09-17 DIAGNOSIS — F411 Generalized anxiety disorder: Secondary | ICD-10-CM

## 2022-09-17 MED ORDER — CLONAZEPAM 0.5 MG PO TABS: 0.5000 mg | ORAL_TABLET | Freq: Every day | ORAL | 1 refills | Status: DC

## 2022-10-08 ENCOUNTER — Telehealth: Payer: Self-pay | Admitting: Neurology

## 2022-10-08 DIAGNOSIS — G4730 Sleep apnea, unspecified: Secondary | ICD-10-CM

## 2022-10-08 NOTE — Addendum Note (Signed)
Addended by: Guy Begin on: 10/08/2022 04:49 PM   Modules accepted: Orders

## 2022-10-08 NOTE — Telephone Encounter (Addendum)
LMVM for pt that will reduce pressure (per Dr. Frances Furbish) to 7cm.  See if that makes easier to use and still be effective.  She is to call back if questions.  I made change in airview.  Order placed to cosign by Dr. Frances Furbish. Then to send order to [facility].

## 2022-10-08 NOTE — Telephone Encounter (Signed)
Pls reduce pressure to 7 cm with EPR of 2.

## 2022-10-08 NOTE — Telephone Encounter (Signed)
Pt is calling. Stated she feels the pressure is too high because the air is filling up in her mouth. Pt said this is keeping her up at night, Pt is requesting a call-back from nurse.

## 2022-10-08 NOTE — Telephone Encounter (Signed)
Setup 09/24/22, first used 10/04/22.

## 2022-10-09 NOTE — Telephone Encounter (Signed)
I faxed new order to [facility] with fax confirmation.

## 2022-11-22 ENCOUNTER — Encounter: Payer: Self-pay | Admitting: Family Medicine

## 2022-11-22 ENCOUNTER — Other Ambulatory Visit: Payer: Self-pay | Admitting: Nurse Practitioner

## 2022-11-22 ENCOUNTER — Other Ambulatory Visit: Payer: Self-pay | Admitting: Family Medicine

## 2022-11-22 DIAGNOSIS — F330 Major depressive disorder, recurrent, mild: Secondary | ICD-10-CM

## 2022-11-22 DIAGNOSIS — F411 Generalized anxiety disorder: Secondary | ICD-10-CM

## 2022-12-11 NOTE — Progress Notes (Unsigned)
[facility] [address] [place]. [place] [address] [phone]       OFFICE FOLLOW UP NOTE  Lindsay Gregory Date of Birth:  07/28/1961 Medical Record Number:  [id number]   Reason for visit: Initial CPAP follow-up    SUBJECTIVE:   CHIEF COMPLAINT:  No chief complaint on file.   HPI:   Update 12/12/2022 JM: Patient returns for initial CPAP compliance visit.  Initial sleep study 06/2022 showed severe OSA with total AHI of 38.6/h, REM AHI 22.2/h, supine AHI of 68.6/h and O2 nadir 76%.  Completed CPAP titration study which showed significant improvement under CPAP therapy.  CPAP started 09/24/2022.         Consult visit 06/07/2022 Dr. Rexene Alberts: Lindsay Gregory is a 62 year old right-handed woman with an underlying medical history of allergies, anxiety, depression, planter fasciitis and obesity, who reports snoring and excessive daytime sleepiness as well as witnessed apneas per family and friends.  I reviewed your office note from 04/26/2022.  Her Epworth sleepiness score is 14 out of 24, fatigue severity score is 54 out of 63.  She is divorced and lives alone, she has 1 grown son, age 75.  She has been told by multiple family members that she has pausing in her breathing and loud snoring.  She goes to bed currently around midnight and rise time is around 7:30 AM.  During the school year she will typically go to bed around 9:30 PM and rise time is at 5:45 AM.  She is a middle school Mining engineer.  She has multiple animals, mostly outside including goats and horses, she has 3 dogs in the home, typically they sleep in the bedroom on the floor.  She does have a TV on in her bedroom at night for noise and distraction primarily.  She has had weight fluctuation and was able to lose weight but gained most of it back and is currently scheduled to see a nutritionist.  She has seen ENT in the past and was told that she had a mildly deviated septum enlarged tonsils.   She denies night to night nocturia or recurrent morning or nocturnal headaches.  She is not aware of any family history of sleep apnea.  She drinks caffeine in the form of coffee, 2 cups in the mornings, rare alcohol, she is a non-smoker.     ROS:   14 system review of systems performed and negative with exception of those listed in HPI  PMH:  Past Medical History:  Diagnosis Date   Anxiety    Plantar fasciitis     PSH:  Past Surgical History:  Procedure Laterality Date   ABLATION  02/03/2007   CERVICAL SPINE SURGERY  2013    Social History:  Social History   Socioeconomic History   Marital status: Divorced    Spouse name: Not on file   Number of children: Not on file   Years of education: Not on file   Highest education level: Not on file  Occupational History   Not on file  Tobacco Use   Smoking status: Never   Smokeless tobacco: Never  Vaping Use   Vaping Use: Never used  Substance and Sexual Activity   Alcohol use: Yes    Comment: rarely   Drug use: Never   Sexual activity: Not Currently    Birth control/protection: Surgical    Comment: ablation  Other Topics Concern   Not on file  Social History Narrative   Not on  file   Social Determinants of Health   Financial Resource Strain: Not on file  Food Insecurity: Not on file  Transportation Needs: Not on file  Physical Activity: Not on file  Stress: Not on file  Social Connections: Not on file  Intimate Partner Violence: Not on file    Family History:  Family History  Problem Relation Age of Onset   Ovarian cancer Other        Maternal Great Aunt   Sleep apnea Neg Hx    Breast cancer Neg Hx     Medications:   Current Outpatient Medications on File Prior to Visit  Medication Sig Dispense Refill   clonazePAM (KLONOPIN) 0.5 MG tablet TAKE 1 TABLET BY MOUTH EVERY DAY 30 tablet 2   escitalopram (LEXAPRO) 10 MG tablet TAKE 1 TABLET BY MOUTH EVERY DAY 90 tablet 1   mometasone (NASONEX) 50 MCG/ACT  nasal spray Place 2 sprays into the nose daily. 17 each 2   Multiple Vitamin (MULTIVITAMIN PO) Take by mouth daily.     VITAMIN D PO Take by mouth daily.     No current facility-administered medications on file prior to visit.    Allergies:   Allergies  Allergen Reactions   Amoxicillin Hives and Itching      OBJECTIVE:  Physical Exam  There were no vitals filed for this visit. There is no height or weight on file to calculate BMI. No results found.   General: well developed, well nourished, seated, in no evident distress Head: head normocephalic and atraumatic.   Neck: supple with no carotid or supraclavicular bruits Cardiovascular: regular rate and rhythm, no murmurs Musculoskeletal: no deformity Skin:  no rash/petichiae Vascular:  Normal pulses all extremities   Neurologic Exam Mental Status: Awake and fully alert. Oriented to place and time. Recent and remote memory intact. Attention span, concentration and fund of knowledge appropriate. Mood and affect appropriate.  Cranial Nerves: Pupils equal, briskly reactive to light. Extraocular movements full without nystagmus. Visual fields full to confrontation. Hearing intact. Facial sensation intact. Face, tongue, palate moves normally and symmetrically.  Motor: Normal bulk and tone. Normal strength in all tested extremity muscles Sensory.: intact to touch , pinprick , position and vibratory sensation.  Coordination: Rapid alternating movements normal in all extremities. Finger-to-nose and heel-to-shin performed accurately bilaterally. Gait and Station: Arises from chair without difficulty. Stance is normal. Gait demonstrates normal stride length and balance without use of AD. Tandem walk and heel toe without difficulty.  Reflexes: 1+ and symmetric. Toes downgoing.         ASSESSMENT/PLAN: Lindsay Gregory is a 62 y.o. year old female    OSA on CPAP : Compliance report shows satisfactory usage with optimal residual  AHI.  Discussed continued nightly usage with ensuring greater than 4 hours nightly for optimal benefit and per insurance purposes.  Continue to follow with DME company for any needed supplies or CPAP related concerns     Follow up in *** or call earlier if needed   CC:  PCP: Cox, Elnita Maxwell, MD    I spent *** minutes of face-to-face and non-face-to-face time with patient.  This included previsit chart review, lab review, study review, order entry, electronic health record documentation, patient education regarding diagnosis of sleep apnea with review and discussion of compliance report and answered all other questions to patient's satisfaction   Frann Rider, [facility]  [facility] [address] [place], [place] [address]  Phone [phone] Fax [phone] Note: This  document was prepared with digital dictation and possible smart phrase technology. Any transcriptional errors that result from this process are unintentional.

## 2022-12-12 ENCOUNTER — Encounter: Payer: Self-pay | Admitting: Adult Health

## 2022-12-12 ENCOUNTER — Ambulatory Visit: Payer: BC Managed Care – PPO | Admitting: Adult Health

## 2022-12-12 VITALS — BP 118/74 | HR 54 | Ht 65.0 in | Wt 203.6 lb

## 2022-12-12 DIAGNOSIS — G4733 Obstructive sleep apnea (adult) (pediatric): Secondary | ICD-10-CM

## 2022-12-12 NOTE — Patient Instructions (Addendum)
Continue nightly use of CPAP for adequate sleep apnea  You will be called by [facility] to do mask refitting, if you do not hear from them by mid next week, please call them to schedule     Follow up in 6 months or call earlier if needed

## 2023-01-22 ENCOUNTER — Encounter: Payer: Self-pay | Admitting: Family Medicine

## 2023-01-22 ENCOUNTER — Ambulatory Visit (INDEPENDENT_AMBULATORY_CARE_PROVIDER_SITE_OTHER): Payer: BC Managed Care – PPO | Admitting: Family Medicine

## 2023-01-22 VITALS — BP 116/82 | HR 81 | Temp 97.0°F | Ht 65.5 in | Wt 207.0 lb

## 2023-01-22 DIAGNOSIS — E782 Mixed hyperlipidemia: Secondary | ICD-10-CM | POA: Diagnosis not present

## 2023-01-22 DIAGNOSIS — F411 Generalized anxiety disorder: Secondary | ICD-10-CM

## 2023-01-22 DIAGNOSIS — R635 Abnormal weight gain: Secondary | ICD-10-CM

## 2023-01-22 DIAGNOSIS — J029 Acute pharyngitis, unspecified: Secondary | ICD-10-CM | POA: Diagnosis not present

## 2023-01-22 DIAGNOSIS — E6609 Other obesity due to excess calories: Secondary | ICD-10-CM

## 2023-01-22 DIAGNOSIS — R7303 Prediabetes: Secondary | ICD-10-CM | POA: Diagnosis not present

## 2023-01-22 DIAGNOSIS — F5081 Binge eating disorder: Secondary | ICD-10-CM

## 2023-01-22 DIAGNOSIS — Z6833 Body mass index (BMI) 33.0-33.9, adult: Secondary | ICD-10-CM

## 2023-01-22 DIAGNOSIS — F330 Major depressive disorder, recurrent, mild: Secondary | ICD-10-CM | POA: Diagnosis not present

## 2023-01-22 LAB — POCT RAPID STREP A (OFFICE): Rapid Strep A Screen: NEGATIVE

## 2023-01-22 MED ORDER — ESCITALOPRAM OXALATE 20 MG PO TABS: 10.0000 mg | ORAL_TABLET | Freq: Every day | ORAL | 1 refills | Status: DC

## 2023-01-22 NOTE — Patient Instructions (Addendum)
Contact our office in 3-4 weeks to inform us with how you are adjusting to starting Vyvanse and the increased dose of lexapro. We will send your lab results to you via mychart.

## 2023-01-22 NOTE — Progress Notes (Unsigned)
Subjective:  Patient ID: Lindsay Gregory, female    DOB: 11-19-1960  Age: 62 y.o. MRN: [id number]  Chief Complaint  Patient presents with   Anxiety   Prediabetes    HPI: Anxiety: Patient is taking Klonopin 0.5 mg once daily as needed, Lexapro 10 mg daily. Patient feels anxious a lot. Does not feel lexapro is doing as well as she would like. A little depressed. PHQ9 normal.    Apnea: Uses CPAP every night. Helping.  Prediabetes: See's dietician. A1c 6.0. Emotional eating.   Patient had a hay delivery three days ago. Had sneezing, runny nose. No fevers.         BEDS-7   During the last 3 months, did you have any episodes of excessive overeating (I.e., eating significantly more than most people would eat in a similar period of time? Yes  NOTE: IF YOUR ANSWERED "NO" TO QUESTIONS 1, YOU MAY STOP. THE REMAINING QUESTIONS DO NOT APPLY.  Do you feel distressed about your episodes of excessive overeating? Yes  Within the past 3 months...  During your episodes of excessive overeating, how often did you feel like you had no control over your eating (e.g., not being able to stop eating, feel compelled to eat, or going back and forth for more food)? Always  During your episodes of excessive overeating, how often did you continue eating even though you were not hungry? Often  During your episodes of excessive overeating, how often were you embarrassed by how much you ate? Often  During your episodes of excessive overeating, how often did you feel disgusted with yourself or guilty afterward? Sometimes  During your episodes of excessive overeating, how often did you make yourself vomit as a means to control your weight or shape? Never or Rarely       01/22/2023    3:37 PM 06/14/2022    3:16 PM 04/26/2022    3:31 PM 05/30/2021    8:45 AM 11/24/2020    3:58 PM  Depression screen PHQ 2/9  Decreased Interest 0 0 '1 1 1  '$ Down, Depressed, Hopeless 1 0 1 1 0  PHQ - 2 Score 1 0 '2 2 1   '$ Altered sleeping 0 0 0 1 0  Tired, decreased energy 0 '2 3 1 '$ 0  Change in appetite 0 '2 3 1 1  '$ Feeling bad or failure about yourself  0 0 '1 1 1  '$ Trouble concentrating 0 0 0 1 0  Moving slowly or fidgety/restless 0 0 0 0 0  Suicidal thoughts 0 0 0 0 0  PHQ-9 Score '1 4 9 7 3  '$ Difficult doing work/chores Not difficult at all Not difficult at all Not difficult at all Very difficult Somewhat difficult         05/30/2021    8:36 AM 04/26/2022    3:27 PM 01/22/2023    3:37 PM  Fall Risk  Falls in the past year? 0 0 0  Was there an injury with Fall? 0 0 0  Fall Risk Category Calculator 0 0 0  Fall Risk Category (Retired) Low Low   (RETIRED) Patient Fall Risk Level Low fall risk Low fall risk   Patient at Risk for Falls Due to No Fall Risks No Fall Risks No Fall Risks  Fall risk Follow up Falls evaluation completed Falls evaluation completed Falls evaluation completed      Review of Systems  Constitutional:  Negative for chills, fatigue and fever.  HENT:  Positive for congestion, rhinorrhea  and sneezing. Negative for ear pain, sinus pain and sore throat.   Respiratory:  Negative for cough and shortness of breath.   Cardiovascular:  Negative for chest pain.  Gastrointestinal:  Negative for abdominal pain, constipation, diarrhea, nausea and vomiting.  Genitourinary:  Negative for dysuria and urgency.  Musculoskeletal:  Negative for back pain and myalgias.  Neurological:  Negative for dizziness, weakness, light-headedness and headaches.  Psychiatric/Behavioral:  Positive for dysphoric mood. The patient is nervous/anxious.     Current Outpatient Medications on File Prior to Visit  Medication Sig Dispense Refill   clonazePAM (KLONOPIN) 0.5 MG tablet TAKE 1 TABLET BY MOUTH EVERY DAY 30 tablet 2   mometasone (NASONEX) 50 MCG/ACT nasal spray Place 2 sprays into the nose daily. 17 each 2   Multiple Vitamin (MULTIVITAMIN PO) Take by mouth daily.     VITAMIN D PO Take by mouth daily.     No  current facility-administered medications on file prior to visit.   Past Medical History:  Diagnosis Date   Anxiety    Hyperlipidemia    Plantar fasciitis    Prediabetes    Past Surgical History:  Procedure Laterality Date   ABLATION  02/03/2007   CERVICAL SPINE SURGERY  2013    Family History  Problem Relation Age of Onset   Ovarian cancer Other        Maternal Great Aunt   Sleep apnea Neg Hx    Breast cancer Neg Hx    Social History   Socioeconomic History   Marital status: Divorced    Spouse name: Not on file   Number of children: Not on file   Years of education: Not on file   Highest education level: Not on file  Occupational History   Not on file  Tobacco Use   Smoking status: Never   Smokeless tobacco: Never  Vaping Use   Vaping Use: Never used  Substance and Sexual Activity   Alcohol use: Yes    Comment: rarely   Drug use: Never   Sexual activity: Not Currently    Birth control/protection: Surgical    Comment: ablation  Other Topics Concern   Not on file  Social History Narrative   Not on file   Social Determinants of Health   Financial Resource Strain: Low Risk  (01/22/2023)   Overall Financial Resource Strain (CARDIA)    Difficulty of Paying Living Expenses: Not hard at all  Food Insecurity: No Food Insecurity (01/22/2023)   Hunger Vital Sign    Worried About Running Out of Food in the Last Year: Never true    Ran Out of Food in the Last Year: Never true  Transportation Needs: No Transportation Needs (01/22/2023)   PRAPARE - Hydrologist (Medical): No    Lack of Transportation (Non-Medical): No  Physical Activity: Inactive (01/22/2023)   Exercise Vital Sign    Days of Exercise per Week: 0 days    Minutes of Exercise per Session: 0 min  Stress: No Stress Concern Present (01/22/2023)   [place]    Feeling of Stress : Not at all  Social Connections:  Socially Isolated (01/22/2023)   Social Connection and Isolation Panel [NHANES]    Frequency of Communication with Friends and Family: Three times a week    Frequency of Social Gatherings with Friends and Family: Three times a week    Attends Religious Services: Never    Active Member of  Clubs or Organizations: No    Attends Banker Meetings: Never    Marital Status: Divorced    Objective:  BP 116/82   Pulse 81   Temp (!) 97 F (36.1 C)   Ht 5' 5.5" (1.664 m)   Wt 207 lb (93.9 kg)   SpO2 93%   BMI 33.92 kg/m      01/22/2023    3:35 PM 12/12/2022    3:42 PM 06/14/2022    3:06 PM  BP/Weight  Systolic BP 116 118 100  Diastolic BP 82 74 72  Wt. (Lbs) 207 203.6 195  BMI 33.92 kg/m2 33.88 kg/m2 32.45 kg/m2    Physical Exam Vitals reviewed.  Constitutional:      Appearance: Normal appearance. She is normal weight.  HENT:     Right Ear: Tympanic membrane normal.     Left Ear: Tympanic membrane normal.     Nose: Rhinorrhea present.     Mouth/Throat:     Pharynx: Posterior oropharyngeal erythema present.  Neck:     Vascular: No carotid bruit.  Cardiovascular:     Rate and Rhythm: Normal rate and regular rhythm.     Heart sounds: Normal heart sounds.  Pulmonary:     Effort: Pulmonary effort is normal. No respiratory distress.     Breath sounds: Normal breath sounds.  Abdominal:     General: Abdomen is flat. Bowel sounds are normal.     Palpations: Abdomen is soft.     Tenderness: There is no abdominal tenderness.  Lymphadenopathy:     Cervical: Cervical adenopathy present.  Neurological:     Mental Status: She is alert and oriented to person, place, and time.  Psychiatric:        Mood and Affect: Mood normal.        Behavior: Behavior normal.     Diabetic Foot Exam - Simple   No data filed      Lab Results  Component Value Date   WBC 9.8 01/22/2023   HGB 11.9 01/22/2023   HCT 36.8 01/22/2023   PLT 319 01/22/2023   GLUCOSE 88 01/22/2023   CHOL  190 01/22/2023   TRIG 128 01/22/2023   HDL 60 01/22/2023   LDLCALC 107 (H) 01/22/2023   ALT 14 01/22/2023   AST 14 01/22/2023   NA 137 01/22/2023   K 5.3 (H) 01/22/2023   CL 100 01/22/2023   CREATININE 1.23 (H) 01/22/2023   BUN 14 01/22/2023   CO2 23 01/22/2023   TSH 1.090 01/22/2023   HGBA1C 6.4 (H) 01/22/2023      Assessment & Plan:    Mixed hyperlipidemia Assessment & Plan:  Fair controlled Continue to work on eating a healthy diet and exercise.    Orders: -     Comprehensive metabolic panel -     Lipid panel  Depression, major, recurrent, mild (HCC) Assessment & Plan: Continue with Lexapro 10 mg daily.  Orders: -     Escitalopram Oxalate; Take 0.5 tablets (10 mg total) by mouth daily.  Dispense: 90 tablet; Refill: 1  Abnormal weight gain Assessment & Plan: Check labs.  Orders: -     TSH  Prediabetes Assessment & Plan: Hemoglobin A1c 6.0%, 3 month avg of blood sugars, is in prediabetic range.  In order to prevent progression to diabetes, recommend low carb diet and regular exercise   Orders: -     CBC with Differential/Platelet -     Hemoglobin A1c  GAD (generalized anxiety disorder) Assessment &  Plan: The current medical regimen is effective;  continue present plan and medications. Taking Klonopin, lexapro.    Pharyngitis, unspecified etiology Assessment & Plan: Checked rapid strep A test and was negative.  Recommend antihistamines.   Orders: -     POCT rapid strep A  Class 1 obesity due to excess calories with serious comorbidity and body mass index (BMI) of 33.0 to 33.9 in adult Assessment & Plan: Recommend continue to work on eating healthy diet and exercise.    Binge eating disorder Assessment & Plan: Start on vyvanse.  Orders: -     Lisdexamfetamine Dimesylate; Take 1 capsule (30 mg total) by mouth daily.  Dispense: 30 capsule; Refill: 0     Meds ordered this encounter  Medications   escitalopram (LEXAPRO) 20 MG tablet     Sig: Take 0.5 tablets (10 mg total) by mouth daily.    Dispense:  90 tablet    Refill:  1   lisdexamfetamine (VYVANSE) 30 MG capsule    Sig: Take 1 capsule (30 mg total) by mouth daily.    Dispense:  30 capsule    Refill:  0    Orders Placed This Encounter  Procedures   CBC with Differential/Platelet   Comprehensive metabolic panel   Lipid panel   Hemoglobin A1c   TSH   POCT rapid strep A     Follow-up: Return in about 3 months (around 04/24/2023) for fasting, chronic.   I,Katherina A Bramblett,acting as a scribe for Rochel Brome, MD.,have documented all relevant documentation on the behalf of Rochel Brome, MD,as directed by  Rochel Brome, MD while in the presence of Rochel Brome, MD.   Geralynn Ochs I Leal-Borjas,acting as a scribe for Rochel Brome, MD.,have documented all relevant documentation on the behalf of Rochel Brome, MD,as directed by  Rochel Brome, MD while in the presence of Rochel Brome, MD.     An After Visit Summary was printed and given to the patient.  I attest that I have reviewed this visit and agree with the plan scribed by my staff.   Rochel Brome, MD [facility] [phone]

## 2023-01-23 ENCOUNTER — Encounter: Payer: Self-pay | Admitting: Family Medicine

## 2023-01-23 LAB — CBC WITH DIFFERENTIAL/PLATELET
Basophils Absolute: 0.1 10*3/uL (ref 0.0–0.2)
Basos: 1 %
EOS (ABSOLUTE): 0.3 10*3/uL (ref 0.0–0.4)
Eos: 3 %
Hematocrit: 36.8 % (ref 34.0–46.6)
Hemoglobin: 11.9 g/dL (ref 11.1–15.9)
Immature Grans (Abs): 0 10*3/uL (ref 0.0–0.1)
Immature Granulocytes: 0 %
Lymphocytes Absolute: 2.7 10*3/uL (ref 0.7–3.1)
Lymphs: 28 %
MCH: 27.4 pg (ref 26.6–33.0)
MCHC: 32.3 g/dL (ref 31.5–35.7)
MCV: 85 fL (ref 79–97)
Monocytes Absolute: 0.8 10*3/uL (ref 0.1–0.9)
Monocytes: 8 %
Neutrophils Absolute: 5.8 10*3/uL (ref 1.4–7.0)
Neutrophils: 60 %
Platelets: 319 10*3/uL (ref 150–450)
RBC: 4.34 x10E6/uL (ref 3.77–5.28)
RDW: 14.3 % (ref 11.7–15.4)
WBC: 9.8 10*3/uL (ref 3.4–10.8)

## 2023-01-23 LAB — HEMOGLOBIN A1C
Est. average glucose Bld gHb Est-mCnc: 137 mg/dL
Hgb A1c MFr Bld: 6.4 % — ABNORMAL HIGH (ref 4.8–5.6)

## 2023-01-23 LAB — LIPID PANEL
Chol/HDL Ratio: 3.2 ratio (ref 0.0–4.4)
Cholesterol, Total: 190 mg/dL (ref 100–199)
HDL: 60 mg/dL (ref >39)
LDL Chol Calc (NIH): 107 mg/dL — ABNORMAL HIGH (ref 0–99)
Triglycerides: 128 mg/dL (ref 0–149)
VLDL Cholesterol Cal: 23 mg/dL (ref 5–40)

## 2023-01-23 LAB — COMPREHENSIVE METABOLIC PANEL
ALT: 14 IU/L (ref 0–32)
AST: 14 IU/L (ref 0–40)
Albumin/Globulin Ratio: 1.7 (ref 1.2–2.2)
Albumin: 4.3 g/dL (ref 3.8–4.9)
Alkaline Phosphatase: 92 IU/L (ref 44–121)
BUN/Creatinine Ratio: 11 — ABNORMAL LOW (ref 12–28)
BUN: 14 mg/dL (ref 8–27)
Bilirubin Total: <0.2 mg/dL (ref 0.0–1.2)
CO2: 23 mmol/L (ref 20–29)
Calcium: 9.8 mg/dL (ref 8.7–10.3)
Chloride: 100 mmol/L (ref 96–106)
Creatinine, Ser: 1.23 mg/dL — ABNORMAL HIGH (ref 0.57–1.00)
Globulin, Total: 2.6 g/dL (ref 1.5–4.5)
Glucose: 88 mg/dL (ref 70–99)
Potassium: 5.3 mmol/L — ABNORMAL HIGH (ref 3.5–5.2)
Sodium: 137 mmol/L (ref 134–144)
Total Protein: 6.9 g/dL (ref 6.0–8.5)
eGFR: 50 mL/min/{1.73_m2} — ABNORMAL LOW (ref >59)

## 2023-01-23 LAB — CARDIOVASCULAR RISK ASSESSMENT

## 2023-01-23 LAB — TSH: TSH: 1.09 u[IU]/mL (ref 0.450–4.500)

## 2023-01-26 DIAGNOSIS — R635 Abnormal weight gain: Secondary | ICD-10-CM | POA: Insufficient documentation

## 2023-01-26 DIAGNOSIS — J029 Acute pharyngitis, unspecified: Secondary | ICD-10-CM | POA: Insufficient documentation

## 2023-01-26 NOTE — Assessment & Plan Note (Signed)
Fair controlled Continue to work on eating a healthy diet and exercise.

## 2023-01-26 NOTE — Assessment & Plan Note (Signed)
Check labs 

## 2023-01-26 NOTE — Assessment & Plan Note (Addendum)
Checked rapid strep A test and was negative.  Recommend antihistamines.

## 2023-01-26 NOTE — Assessment & Plan Note (Signed)
The current medical regimen is effective;  continue present plan and medications. Taking Klonopin, lexapro.

## 2023-01-26 NOTE — Assessment & Plan Note (Signed)
Hemoglobin A1c 6.0%, 3 month avg of blood sugars, is in prediabetic range.  In order to prevent progression to diabetes, recommend low carb diet and regular exercise 

## 2023-01-26 NOTE — Assessment & Plan Note (Signed)
Continue with Lexapro 10 mg daily.

## 2023-01-27 DIAGNOSIS — F5081 Binge eating disorder: Secondary | ICD-10-CM | POA: Insufficient documentation

## 2023-01-27 MED ORDER — LISDEXAMFETAMINE DIMESYLATE 30 MG PO CAPS: 30.0000 mg | ORAL_CAPSULE | Freq: Every day | ORAL | 0 refills | Status: DC

## 2023-01-27 NOTE — Assessment & Plan Note (Signed)
Start on vyvanse.

## 2023-01-27 NOTE — Assessment & Plan Note (Signed)
Recommend continue to work on eating healthy diet and exercise.  

## 2023-01-28 ENCOUNTER — Telehealth: Payer: Self-pay

## 2023-01-28 ENCOUNTER — Other Ambulatory Visit: Payer: Self-pay

## 2023-01-28 DIAGNOSIS — F411 Generalized anxiety disorder: Secondary | ICD-10-CM

## 2023-01-28 MED ORDER — CLONAZEPAM 0.5 MG PO TABS: 0.5000 mg | ORAL_TABLET | Freq: Every day | ORAL | 2 refills | Status: DC

## 2023-01-28 NOTE — Telephone Encounter (Signed)
PA for generic vyvanse submitted and approved via covermymeds.

## 2023-04-17 ENCOUNTER — Ambulatory Visit: Payer: BC Managed Care – PPO | Admitting: Podiatry

## 2023-05-01 ENCOUNTER — Ambulatory Visit: Payer: BC Managed Care – PPO | Admitting: Podiatry

## 2023-05-07 ENCOUNTER — Ambulatory Visit: Payer: BC Managed Care – PPO | Admitting: Family Medicine

## 2023-05-07 VITALS — BP 110/70 | HR 80 | Temp 96.8°F | Resp 16 | Ht 65.5 in | Wt 204.0 lb

## 2023-05-07 DIAGNOSIS — E6609 Other obesity due to excess calories: Secondary | ICD-10-CM

## 2023-05-07 DIAGNOSIS — F411 Generalized anxiety disorder: Secondary | ICD-10-CM

## 2023-05-07 DIAGNOSIS — E782 Mixed hyperlipidemia: Secondary | ICD-10-CM

## 2023-05-07 DIAGNOSIS — Z23 Encounter for immunization: Secondary | ICD-10-CM | POA: Diagnosis not present

## 2023-05-07 DIAGNOSIS — Z6833 Body mass index (BMI) 33.0-33.9, adult: Secondary | ICD-10-CM

## 2023-05-07 DIAGNOSIS — R7303 Prediabetes: Secondary | ICD-10-CM | POA: Diagnosis not present

## 2023-05-07 DIAGNOSIS — R0681 Apnea, not elsewhere classified: Secondary | ICD-10-CM

## 2023-05-07 DIAGNOSIS — Z1231 Encounter for screening mammogram for malignant neoplasm of breast: Secondary | ICD-10-CM

## 2023-05-07 LAB — LIPID PANEL
Chol/HDL Ratio: 2.7 ratio (ref 0.0–4.4)
Cholesterol, Total: 148 mg/dL (ref 100–199)
HDL: 55 mg/dL (ref >39)
LDL Chol Calc (NIH): 81 mg/dL (ref 0–99)
Triglycerides: 60 mg/dL (ref 0–149)
VLDL Cholesterol Cal: 12 mg/dL (ref 5–40)

## 2023-05-07 LAB — CBC WITH DIFFERENTIAL/PLATELET
Basophils Absolute: 0.1 10*3/uL (ref 0.0–0.2)
Basos: 1 %
EOS (ABSOLUTE): 0.2 10*3/uL (ref 0.0–0.4)
Eos: 3 %
Hematocrit: 38.1 % (ref 34.0–46.6)
Hemoglobin: 12.3 g/dL (ref 11.1–15.9)
Immature Grans (Abs): 0 10*3/uL (ref 0.0–0.1)
Immature Granulocytes: 0 %
Lymphocytes Absolute: 2 10*3/uL (ref 0.7–3.1)
Lymphs: 34 %
MCH: 28.1 pg (ref 26.6–33.0)
MCHC: 32.3 g/dL (ref 31.5–35.7)
MCV: 87 fL (ref 79–97)
Monocytes Absolute: 0.6 10*3/uL (ref 0.1–0.9)
Monocytes: 10 %
Neutrophils Absolute: 3.2 10*3/uL (ref 1.4–7.0)
Neutrophils: 52 %
Platelets: 252 10*3/uL (ref 150–450)
RBC: 4.38 x10E6/uL (ref 3.77–5.28)
RDW: 13.6 % (ref 11.7–15.4)
WBC: 6 10*3/uL (ref 3.4–10.8)

## 2023-05-07 LAB — COMPREHENSIVE METABOLIC PANEL
ALT: 13 IU/L (ref 0–32)
AST: 18 IU/L (ref 0–40)
Albumin: 4.6 g/dL (ref 3.8–4.9)
Alkaline Phosphatase: 76 IU/L (ref 44–121)
BUN/Creatinine Ratio: 14 (ref 12–28)
BUN: 15 mg/dL (ref 8–27)
Bilirubin Total: 0.4 mg/dL (ref 0.0–1.2)
CO2: 23 mmol/L (ref 20–29)
Calcium: 9.6 mg/dL (ref 8.7–10.3)
Chloride: 104 mmol/L (ref 96–106)
Creatinine, Ser: 1.04 mg/dL — ABNORMAL HIGH (ref 0.57–1.00)
Globulin, Total: 2.3 g/dL (ref 1.5–4.5)
Glucose: 104 mg/dL — ABNORMAL HIGH (ref 70–99)
Potassium: 5.2 mmol/L (ref 3.5–5.2)
Sodium: 141 mmol/L (ref 134–144)
Total Protein: 6.9 g/dL (ref 6.0–8.5)
eGFR: 62 mL/min/{1.73_m2} (ref >59)

## 2023-05-07 LAB — HEMOGLOBIN A1C
Est. average glucose Bld gHb Est-mCnc: 128 mg/dL
Hgb A1c MFr Bld: 6.1 % — ABNORMAL HIGH (ref 4.8–5.6)

## 2023-05-07 NOTE — Progress Notes (Signed)
Subjective:  Patient ID: Lindsay Gregory, female    DOB: [birth year]  Age: 62 y.o. MRN: [id number]  Chief Complaint  Patient presents with   Medical Management of Chronic Issues    HPI Anxiety: Patient is taking Klonopin 0.5 mg once daily as needed, Lexapro 20 mg daily. Patient feels anxious a lot. A little depressed.    Apnea: Uses CPAP every night. Helping.   Prediabetes: See's dietician. A1c 6.0. Emotional eating much improved.         05/07/2023    9:40 AM 01/22/2023    3:37 PM 06/14/2022    3:16 PM 04/26/2022    3:31 PM 05/30/2021    8:45 AM  Depression screen PHQ 2/9  Decreased Interest 1 0 0 1 1  Down, Depressed, Hopeless 1 1 0 1 1  PHQ - 2 Score 2 1 0 2 2  Altered sleeping 0 0 0 0 1  Tired, decreased energy 1 0 2 3 1   Change in appetite 1 0 2 3 1   Feeling bad or failure about yourself  0 0 0 1 1  Trouble concentrating 0 0 0 0 1  Moving slowly or fidgety/restless 0 0 0 0 0  Suicidal thoughts 0 0 0 0 0  PHQ-9 Score 4 1 4 9 7   Difficult doing work/chores Somewhat difficult Not difficult at all Not difficult at all Not difficult at all Very difficult        05/07/2023    9:39 AM  Fall Risk   Falls in the past year? 0  Number falls in past yr: 0  Injury with Fall? 0  Risk for fall due to : No Fall Risks  Follow up Falls evaluation completed;Falls prevention discussed    Patient Care Team: Blane Ohara, MD as PCP - General (Family Medicine) Blane Ohara, MD as Referring Physician (Family Medicine) Olivia Mackie, NP as Nurse Practitioner (Gynecology)   Review of Systems  Constitutional:  Positive for fatigue. Negative for chills and fever.  HENT:  Positive for congestion. Negative for rhinorrhea and sore throat.   Respiratory:  Negative for cough and shortness of breath.   Cardiovascular:  Negative for chest pain.  Gastrointestinal:  Negative for abdominal pain, constipation, diarrhea, nausea and vomiting.  Genitourinary:  Negative for dysuria and  urgency.  Musculoskeletal:  Negative for back pain and myalgias.  Skin:  Positive for rash (poison ivy left upper arm).  Neurological:  Negative for dizziness, weakness, light-headedness and headaches.  Psychiatric/Behavioral:  Positive for dysphoric mood. The patient is nervous/anxious.     Current Outpatient Medications on File Prior to Visit  Medication Sig Dispense Refill   clonazePAM (KLONOPIN) 0.5 MG tablet Take 1 tablet (0.5 mg total) by mouth daily. 30 tablet 2   escitalopram (LEXAPRO) 20 MG tablet Take 0.5 tablets (10 mg total) by mouth daily. 90 tablet 1   lisdexamfetamine (VYVANSE) 30 MG capsule Take 1 capsule (30 mg total) by mouth daily. (Patient not taking: Reported on 05/07/2023) 30 capsule 0   mometasone (NASONEX) 50 MCG/ACT nasal spray Place 2 sprays into the nose daily. 17 each 2   Multiple Vitamin (MULTIVITAMIN PO) Take by mouth daily.     VITAMIN D PO Take by mouth daily.     No current facility-administered medications on file prior to visit.   Past Medical History:  Diagnosis Date   Anxiety    Hyperlipidemia    Plantar fasciitis    Prediabetes    Past Surgical History:  Procedure Laterality Date   ABLATION  02/03/2007   CERVICAL SPINE SURGERY  2013    Family History  Problem Relation Age of Onset   Ovarian cancer Other        Maternal Great Aunt   Sleep apnea Neg Hx    Breast cancer Neg Hx    Social History   Socioeconomic History   Marital status: Divorced    Spouse name: Not on file   Number of children: Not on file   Years of education: Not on file   Highest education level: Not on file  Occupational History   Not on file  Tobacco Use   Smoking status: Never   Smokeless tobacco: Never  Vaping Use   Vaping Use: Never used  Substance and Sexual Activity   Alcohol use: Yes    Comment: rarely   Drug use: Never   Sexual activity: Not Currently    Birth control/protection: Surgical    Comment: ablation  Other Topics Concern   Not on file   Social History Narrative   Not on file   Social Determinants of Health   Financial Resource Strain: Low Risk  (01/22/2023)   Overall Financial Resource Strain (CARDIA)    Difficulty of Paying Living Expenses: Not hard at all  Food Insecurity: No Food Insecurity (01/22/2023)   Hunger Vital Sign    Worried About Running Out of Food in the Last Year: Never true    Ran Out of Food in the Last Year: Never true  Transportation Needs: No Transportation Needs (01/22/2023)   PRAPARE - Administrator, Civil Service (Medical): No    Lack of Transportation (Non-Medical): No  Physical Activity: Sufficiently Active (05/07/2023)   Exercise Vital Sign    Days of Exercise per Week: 7 days    Minutes of Exercise per Session: 60 min  Recent Concern: Physical Activity - Inactive (05/07/2023)   Exercise Vital Sign    Days of Exercise per Week: 0 days    Minutes of Exercise per Session: 60 min  Stress: No Stress Concern Present (01/22/2023)   [employer] of Occupational Health - Occupational Stress Questionnaire    Feeling of Stress : Not at all  Social Connections: Socially Isolated (01/22/2023)   Social Connection and Isolation Panel [NHANES]    Frequency of Communication with Friends and Family: Three times a week    Frequency of Social Gatherings with Friends and Family: Three times a week    Attends Religious Services: Never    Active Member of Clubs or Organizations: No    Attends Banker Meetings: Never    Marital Status: Divorced    Objective:  BP 110/70   Pulse 80   Temp (!) 96.8 F (36 C)   Resp 16   Ht 5' 5.5" (1.664 m)   Wt 204 lb (92.5 kg)   BMI 33.43 kg/m      05/07/2023    9:33 AM 01/22/2023    3:35 PM 12/12/2022    3:42 PM  BP/Weight  Systolic BP 110 116 118  Diastolic BP 70 82 74  Wt. (Lbs) 204 207 203.6  BMI 33.43 kg/m2 33.92 kg/m2 33.88 kg/m2    Physical Exam Vitals reviewed.  Constitutional:      Appearance: Normal appearance. She is  normal weight.  Neck:     Vascular: No carotid bruit.  Cardiovascular:     Rate and Rhythm: Normal rate and regular rhythm.     Heart sounds:  Normal heart sounds.  Pulmonary:     Effort: Pulmonary effort is normal. No respiratory distress.     Breath sounds: Normal breath sounds.  Abdominal:     General: Abdomen is flat. Bowel sounds are normal.     Palpations: Abdomen is soft.     Tenderness: There is no abdominal tenderness.  Neurological:     Mental Status: She is alert and oriented to person, place, and time.  Psychiatric:        Mood and Affect: Mood normal.        Behavior: Behavior normal.     Diabetic Foot Exam - Simple   No data filed      Lab Results  Component Value Date   WBC 6.0 05/07/2023   HGB 12.3 05/07/2023   HCT 38.1 05/07/2023   PLT 252 05/07/2023   GLUCOSE 104 (H) 05/07/2023   CHOL 148 05/07/2023   TRIG 60 05/07/2023   HDL 55 05/07/2023   LDLCALC 81 05/07/2023   ALT 13 05/07/2023   AST 18 05/07/2023   NA 141 05/07/2023   K 5.2 05/07/2023   CL 104 05/07/2023   CREATININE 1.04 (H) 05/07/2023   BUN 15 05/07/2023   CO2 23 05/07/2023   TSH 1.090 01/22/2023   HGBA1C 6.1 (H) 05/07/2023      Assessment & Plan:    GAD (generalized anxiety disorder) Assessment & Plan: The current medical regimen is effective;  continue present plan and medications. Taking Klonopin, lexapro.    Prediabetes Assessment & Plan: Hemoglobin A1c 6.0%, 3 month avg of blood sugars, is in prediabetic range.  In order to prevent progression to diabetes, recommend low carb diet and regular exercise   Orders: -     Hemoglobin A1c  Mixed hyperlipidemia Assessment & Plan: Recommend continue to work on eating healthy diet and exercise.    Orders: -     CBC with Differential/Platelet -     Comprehensive metabolic panel -     Lipid panel  Class 1 obesity due to excess calories with serious comorbidity and body mass index (BMI) of 33.0 to 33.9 in adult Assessment  & Plan: Recommend continue to work on eating healthy diet and exercise.    Need for vaccination -     Varicella-zoster vaccine IM  Encounter for screening mammogram for malignant neoplasm of breast -     3D Screening Mammogram, Left and Right; Future  Apnea Assessment & Plan: Compliant and beneficial.      No orders of the defined types were placed in this encounter.   Orders Placed This Encounter  Procedures   MM 3D SCREENING MAMMOGRAM BILATERAL BREAST   Zoster Recombinant (Shingrix )   CBC with Differential/Platelet   Comprehensive metabolic panel   Lipid panel   Hemoglobin A1c     Follow-up: Return in about 3 months (around 08/07/2023) for chronic follow up, fasting.   I,Carolyn M Morrison,acting as a Neurosurgeon for Blane Ohara, MD.,have documented all relevant documentation on the behalf of Blane Ohara, MD,as directed by  Blane Ohara, MD while in the presence of Blane Ohara, MD.   Clayborn Bigness I Leal-Borjas,acting as a scribe for Blane Ohara, MD.,have documented all relevant documentation on the behalf of Blane Ohara, MD,as directed by  Blane Ohara, MD while in the presence of Blane Ohara, MD.    An After Visit Summary was printed and given to the patient.  I attest that I have reviewed this visit and agree with the plan scribed  by my staff.   Blane Ohara, MD [facility] [phone]

## 2023-05-08 ENCOUNTER — Ambulatory Visit: Payer: BC Managed Care – PPO | Admitting: Podiatry

## 2023-05-08 DIAGNOSIS — M722 Plantar fascial fibromatosis: Secondary | ICD-10-CM | POA: Diagnosis not present

## 2023-05-08 MED ORDER — METHYLPREDNISOLONE 4 MG PO TBPK
ORAL_TABLET | ORAL | 0 refills | Status: DC
Start: 2023-05-08 — End: 2023-08-16

## 2023-05-08 NOTE — Progress Notes (Signed)
Chief Complaint  Patient presents with   Foot Pain    Patient was complaining of pain to bilateral heels. 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 Patient is not working due to the summer time and she is feeling better. Patient was seen in 2018 and was given a steroid injection and it was effective. The pain does not radiate.     HPI: 62 y.o. female presenting today with c/o pain in the bottom and along the periphery of both heels.  Denies trauma.  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  Also notes pain in the morning when first getting out of bed and has to occasionally limp.  After few minutes the pain improves.  Since the school has been out for the summer she has already had some improvement in her symptoms since making the appointment.  She did respond well to a cortisone injection in the past.  Past Medical History:  Diagnosis Date   Anxiety    Hyperlipidemia    Plantar fasciitis    Prediabetes     Past Surgical History:  Procedure Laterality Date   ABLATION  02/03/2007   CERVICAL SPINE SURGERY  2013    Allergies  Allergen Reactions   Amoxicillin Hives and Itching     Physical Exam: General: The patient is alert and oriented x3 in no acute distress.  Dermatology:  No ecchymosis, erythema, or edema bilateral.  No open lesions.    Vascular: Palpable pedal pulses bilaterally. Capillary refill within normal limits.  No appreciable edema.    Neurological: Light touch sensation intact bilateral.  MMT 5/5 to lower extremity bilateral. Negative Tinel's sign with percussion of the posterior tibial nerve on the affected extremity.    Musculoskeletal Exam:  There is pain on palpation of the plantarmedial & plantarcentral aspect of both heels.  There is pain on palpation along the posterior plantar aspect of both heels.  There is no Achilles tendon pain at the insertion.   No gaps or nodules within the plantar fascia.  Positive Windlass mechanism bilateral.  Antalgic gait noted with first few steps upon standing.  No pain on palpation of achilles tendon bilateral.  Ankle df less than 10 degrees with knee extended b/l.  There is a flexible flatfoot upon weightbearing.  Radiographic Exam (bilateral foot, 3 weightbearing views, 05/08/2023):  Normal osseous mineralization. Joint spaces preserved.  No fractures or osseous irregularities noted.  Assessment/Plan of Care: 1. Plantar fascial fibromatosis   2. Capsulitis of foot     Meds ordered this encounter  Medications   methylPREDNISolone (MEDROL DOSEPAK) 4 MG TBPK tablet    Sig: Take as directed    Dispense:  21 tablet    Refill:  0   FOR HOME USE ONLY DME NIGHT SPLINT DG FOOT COMPLETE RIGHT  -Reviewed etiology of plantar fasciitis with patient.  Discussed treatment options with patient today, including cortisone injection, NSAID course of treatment, stretching exercises, physical therapy, use of night splint, rest, icing the heel, arch supports/orthotics, and supportive shoe gear.    Patient was fitted for a night splint and she will start wearing on the right foot since that is the more symptomatic foot.  Also discussed the calf stretches and calf massage as well as arch massage with a golf ball to perform daily.  Discussed getting gel heel cushions from Dr. Margart Sickles  or some other brand at the store which will provide cushioning to the plantar and periphery of the heels as well as a slight heel lift.  Informed patient that the clear gel heel cups with the blue center dot are the best for this.  She will look for these.  She can also get them on [employer].  Discussed sending in a Medrol Dosepak since she is not very symptomatic today.  She will fill the Medrol Dosepak and say for if she is having a flareup and then she can begin taking this medication.  She noted she had to wait a while for this appointment today and  did not want to have her in pain while waiting for next appointment.  Will hold off on a cortisone injection since she is already doing better.  Return if symptoms worsen or fail to improve.   Clerance Lav, DPM, FACFAS [facility]     [address] [place], [place] [address]                Office [phone]  Fax [phone]

## 2023-05-09 NOTE — Assessment & Plan Note (Signed)
Recommend continue to work on eating healthy diet and exercise.  

## 2023-05-09 NOTE — Assessment & Plan Note (Signed)
The current medical regimen is effective;  continue present plan and medications. Taking Klonopin, lexapro.  

## 2023-05-09 NOTE — Assessment & Plan Note (Signed)
Hemoglobin A1c 6.0%, 3 month avg of blood sugars, is in prediabetic range.  In order to prevent progression to diabetes, recommend low carb diet and regular exercise 

## 2023-05-12 ENCOUNTER — Encounter: Payer: Self-pay | Admitting: Family Medicine

## 2023-05-12 DIAGNOSIS — R0681 Apnea, not elsewhere classified: Secondary | ICD-10-CM | POA: Insufficient documentation

## 2023-05-12 DIAGNOSIS — Z1231 Encounter for screening mammogram for malignant neoplasm of breast: Secondary | ICD-10-CM | POA: Insufficient documentation

## 2023-05-12 DIAGNOSIS — Z23 Encounter for immunization: Secondary | ICD-10-CM | POA: Insufficient documentation

## 2023-05-12 NOTE — Assessment & Plan Note (Signed)
Compliant and beneficial.

## 2023-05-14 ENCOUNTER — Other Ambulatory Visit: Payer: Self-pay

## 2023-05-14 DIAGNOSIS — F411 Generalized anxiety disorder: Secondary | ICD-10-CM

## 2023-05-14 MED ORDER — CLONAZEPAM 0.5 MG PO TABS: 0.5000 mg | ORAL_TABLET | Freq: Every day | ORAL | 2 refills | Status: DC

## 2023-05-27 ENCOUNTER — Ambulatory Visit: Payer: BC Managed Care – PPO | Admitting: Nurse Practitioner

## 2023-06-03 ENCOUNTER — Encounter: Payer: Self-pay | Admitting: Adult Health

## 2023-06-03 ENCOUNTER — Other Ambulatory Visit: Payer: Self-pay

## 2023-06-03 ENCOUNTER — Telehealth: Payer: BC Managed Care – PPO | Admitting: Adult Health

## 2023-06-03 DIAGNOSIS — F330 Major depressive disorder, recurrent, mild: Secondary | ICD-10-CM

## 2023-06-03 DIAGNOSIS — G4733 Obstructive sleep apnea (adult) (pediatric): Secondary | ICD-10-CM | POA: Diagnosis not present

## 2023-06-03 MED ORDER — ESCITALOPRAM OXALATE 20 MG PO TABS
10.0000 mg | ORAL_TABLET | Freq: Every day | ORAL | 1 refills | Status: DC
Start: 2023-06-03 — End: 2023-09-16

## 2023-06-03 NOTE — Progress Notes (Signed)
[facility] [address] [place]. [place] [address] [phone]       OFFICE FOLLOW UP NOTE  Ms. Lindsay Gregory Date of Birth:  Sep 26, 1961 Medical Record Number:  [id number]   Reason for visit: CPAP follow-up  Virtual Visit via Video Note  I connected with Lindsay Gregory on 06/03/23 at  3:15 PM EDT by a video enabled telemedicine application and verified that I am speaking with the correct person using two identifiers.  Location: Patient: at home Provider: in office   I discussed the limitations of evaluation and management by telemedicine and the availability of in person appointments. The patient expressed understanding and agreed to proceed.    SUBJECTIVE:   Brief HPI:   Lindsay Gregory is a 62 y.o. female who was initially seen by Dr. Frances Furbish in 05/2022 for concern of underlying sleep apnea. Initial sleep study 06/2022 showed severe OSA with total AHI of 38.6/h, REM AHI 22.2/h, supine AHI of 68.6/h and O2 nadir 76%.  Completed CPAP titration study which showed significant improvement under CPAP therapy.  CPAP started 09/24/2022.  At prior initial CPAP visit on 12/12/2022, compliance report shows satisfactory usage and optimal residual AHI.  Concern mask was too large contributing to leaks therefore order for mask refitting placed.  Continued current pressure settings.   Interval history: Patient being seen for CPAP compliance visit.  Compliance report shows suboptimal usage although optimal residual AHI with use. She does admit to not being as strict with use over the summer months due to the heat and making her feel claustrophobic and not having a good sleep routine. 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 She is aware of needing to restart as she does notice benefit when using, she also feels like getting back into a normal sleep routine will help with compliance. She has been trying to stay active and has lost about 10 pounds  over the summer. She was not seen by DME company for mask refitting since prior visit, still feels like mask is too big, plans on calling them to follow up on this.       Update 12/12/2022 JM: Patient returns for initial CPAP compliance visit.  Reports tolerating CPAP well, initially having difficulty tolerating pressure but now tolerating pressure without difficulty. She does question if her mask is too large, will feel like she can't move or else her mask will slide and at times cause leaks. DME company told her she needed to wait until she was complaint before she was able to get a mask refitting. She has noticed a significant improvement of daytime energy levels and motivation, she also feels more clear headed and improved focusing.   Epworth Sleepiness Scale 6/24 (prior to CPAP 14/24) Decreased severity scale 24/63 (prior to CPAP 54/63)          ROS:   14 system review of systems performed and negative with exception of those listed in HPI  PMH:  Past Medical History:  Diagnosis Date   Anxiety    Hyperlipidemia    Plantar fasciitis    Prediabetes     PSH:  Past Surgical History:  Procedure Laterality Date   ABLATION  02/03/2007   CERVICAL SPINE SURGERY  2013    Social History:  Social History   Socioeconomic History   Marital status: Divorced    Spouse name: Not on file   Number of children: Not on file   Years of education: Not  on file   Highest education level: Not on file  Occupational History   Not on file  Tobacco Use   Smoking status: Never   Smokeless tobacco: Never  Vaping Use   Vaping status: Never Used  Substance and Sexual Activity   Alcohol use: Yes    Comment: rarely   Drug use: Never   Sexual activity: Not Currently    Birth control/protection: Surgical    Comment: ablation  Other Topics Concern   Not on file  Social History Narrative   Not on file   Social Determinants of Health   Financial Resource Strain: Low Risk  (01/22/2023)    Overall Financial Resource Strain (CARDIA)    Difficulty of Paying Living Expenses: Not hard at all  Food Insecurity: No Food Insecurity (01/22/2023)   Hunger Vital Sign    Worried About Running Out of Food in the Last Year: Never true    Ran Out of Food in the Last Year: Never true  Transportation Needs: No Transportation Needs (01/22/2023)   PRAPARE - Administrator, Civil Service (Medical): No    Lack of Transportation (Non-Medical): No  Physical Activity: Sufficiently Active (05/07/2023)   Exercise Vital Sign    Days of Exercise per Week: 7 days    Minutes of Exercise per Session: 60 min  Recent Concern: Physical Activity - Inactive (05/07/2023)   Exercise Vital Sign    Days of Exercise per Week: 0 days    Minutes of Exercise per Session: 60 min  Stress: No Stress Concern Present (01/22/2023)   [employer] of Occupational Health - Occupational Stress Questionnaire    Feeling of Stress : Not at all  Social Connections: Socially Isolated (01/22/2023)   Social Connection and Isolation Panel [NHANES]    Frequency of Communication with Friends and Family: Three times a week    Frequency of Social Gatherings with Friends and Family: Three times a week    Attends Religious Services: Never    Active Member of Clubs or Organizations: No    Attends Banker Meetings: Never    Marital Status: Divorced  Catering manager Violence: Not At Risk (01/22/2023)   Humiliation, Afraid, Rape, and Kick questionnaire    Fear of Current or Ex-Partner: No    Emotionally Abused: No    Physically Abused: No    Sexually Abused: No    Family History:  Family History  Problem Relation Age of Onset   Ovarian cancer Other        Maternal Great Aunt   Sleep apnea Neg Hx    Breast cancer Neg Hx     Medications:   Current Outpatient Medications on File Prior to Visit  Medication Sig Dispense Refill   clonazePAM (KLONOPIN) 0.5 MG tablet Take 1 tablet (0.5 mg total) by mouth  daily. 30 tablet 2   escitalopram (LEXAPRO) 20 MG tablet Take 0.5 tablets (10 mg total) by mouth daily. 90 tablet 1   lisdexamfetamine (VYVANSE) 30 MG capsule Take 1 capsule (30 mg total) by mouth daily. (Patient not taking: Reported on 05/07/2023) 30 capsule 0   methylPREDNISolone (MEDROL DOSEPAK) 4 MG TBPK tablet Take as directed 21 tablet 0   mometasone (NASONEX) 50 MCG/ACT nasal spray Place 2 sprays into the nose daily. 17 each 2   Multiple Vitamin (MULTIVITAMIN PO) Take by mouth daily.     VITAMIN D PO Take by mouth daily.     No current facility-administered medications on file prior to  visit.    Allergies:   Allergies  Allergen Reactions   Amoxicillin Hives and Itching      OBJECTIVE:  Physical Exam N/A d/t visit type        ASSESSMENT/PLAN: Lindsay Gregory is a 62 y.o. year old female    OSA on CPAP : Compliance report shows low usage although optimal residual AHI with use.  Discussed importance of nightly CPAP use, has been having difficulty with compliance due to summer heat and lack of good sleep routine. Does plan on getting back using routinely especially when school year restarts. Continue current pressure setting.  Advised to f/u with DME regarding mask refitting, new order will placed if needed. Continue to follow with DME company for any needed supplies or CPAP related concerns     Follow up in 1 year or call earlier if needed   CC:  PCP: Cox, Kirsten, MD    I spent 16 minutes of face-to-face and non-face-to-face time with patient via MyChart video visit.  This included previsit chart review, lab review, study review, order entry, electronic health record documentation, patient education regarding diagnosis of sleep apnea with review and discussion of compliance report and answered all other questions to patient's satisfaction   Ihor Austin, [facility]  [facility] [address] [place], [place] [address]  Phone  [phone] Fax [phone] Note: This document was prepared with digital dictation and possible smart phrase technology. Any transcriptional errors that result from this process are unintentional.

## 2023-06-04 ENCOUNTER — Telehealth: Payer: Self-pay

## 2023-06-19 ENCOUNTER — Ambulatory Visit
Admission: RE | Admit: 2023-06-19 | Discharge: 2023-06-19 | Disposition: A | Discharge status: Home / Self Care | Payer: BC Managed Care – PPO | Source: Ambulatory Visit | Attending: Family Medicine | Admitting: Family Medicine

## 2023-06-19 DIAGNOSIS — Z1231 Encounter for screening mammogram for malignant neoplasm of breast: Secondary | ICD-10-CM

## 2023-06-24 ENCOUNTER — Other Ambulatory Visit: Payer: Self-pay | Admitting: Family Medicine

## 2023-06-24 DIAGNOSIS — R928 Other abnormal and inconclusive findings on diagnostic imaging of breast: Secondary | ICD-10-CM

## 2023-07-04 ENCOUNTER — Other Ambulatory Visit: Payer: Self-pay | Admitting: Family Medicine

## 2023-07-04 ENCOUNTER — Ambulatory Visit
Admission: RE | Admit: 2023-07-04 | Discharge: 2023-07-04 | Disposition: A | Discharge status: Home / Self Care | Payer: BC Managed Care – PPO | Source: Ambulatory Visit | Attending: Family Medicine | Admitting: Family Medicine

## 2023-07-04 DIAGNOSIS — N6310 Unspecified lump in the right breast, unspecified quadrant: Secondary | ICD-10-CM

## 2023-07-04 DIAGNOSIS — R928 Other abnormal and inconclusive findings on diagnostic imaging of breast: Secondary | ICD-10-CM

## 2023-07-05 ENCOUNTER — Ambulatory Visit
Admission: RE | Admit: 2023-07-05 | Discharge: 2023-07-05 | Disposition: A | Discharge status: Home / Self Care | Payer: BC Managed Care – PPO | Source: Ambulatory Visit | Attending: Family Medicine | Admitting: Family Medicine

## 2023-07-05 DIAGNOSIS — R928 Other abnormal and inconclusive findings on diagnostic imaging of breast: Secondary | ICD-10-CM

## 2023-07-05 DIAGNOSIS — N6310 Unspecified lump in the right breast, unspecified quadrant: Secondary | ICD-10-CM

## 2023-07-06 ENCOUNTER — Other Ambulatory Visit: Payer: Self-pay | Admitting: Family Medicine

## 2023-07-06 DIAGNOSIS — F411 Generalized anxiety disorder: Secondary | ICD-10-CM

## 2023-08-15 NOTE — Progress Notes (Signed)
Subjective:  Patient ID: Lindsay Gregory, female    DOB: 20-Jul-1961  Age: 62 y.o. MRN: [id number]  Chief Complaint  Patient presents with   Medical Management of Chronic Issues    HPI   Anxiety/Depression: Patient is taking Klonopin 0.5 mg once daily as needed, Lexapro 20 mg daily. Patient feels anxious a lot. A little depressed.  Patient states that she has not had any major issues or flairs of anxiety and depression. She feels the dose are just right for her  Binge eating: She was sent vyvanse 30 mg but has not been able to take it due to pharmacy not having it. Stated that she has has tried multiple times to call and get it filled, but they never have it. She is going to try a different pharmacy this time to see if they would have it for her.   Apnea: Uses CPAP every night. Helping.   Prediabetes: See's dietician. A1c 6.1. Emotional eating much improved.       08/16/2023    7:32 AM 05/07/2023    9:40 AM 01/22/2023    3:37 PM 06/14/2022    3:16 PM 04/26/2022    3:31 PM  Depression screen PHQ 2/9  Decreased Interest 0 1 0 0 1  Down, Depressed, Hopeless 0 1 1 0 1  PHQ - 2 Score 0 2 1 0 2  Altered sleeping 0 0 0 0 0  Tired, decreased energy 0 1 0 2 3  Change in appetite 3 1 0 2 3  Feeling bad or failure about yourself  1 0 0 0 1  Trouble concentrating 0 0 0 0 0  Moving slowly or fidgety/restless 0 0 0 0 0  Suicidal thoughts 0 0 0 0 0  PHQ-9 Score 4 4 1 4 9   Difficult doing work/chores Not difficult at all Somewhat difficult Not difficult at all Not difficult at all Not difficult at all        08/16/2023    7:32 AM  Fall Risk   Falls in the past year? 0  Number falls in past yr: 0  Injury with Fall? 0  Risk for fall due to : No Fall Risks  Follow up Falls evaluation completed    Patient Care Team: Blane Ohara, MD as PCP - General (Family Medicine) Blane Ohara, MD as Referring Physician (Family Medicine) Olivia Mackie, NP as Nurse Practitioner (Gynecology)    Review of Systems  Constitutional:  Negative for fatigue.  HENT:  Negative for congestion, ear pain and sore throat.   Respiratory:  Negative for cough and shortness of breath.   Cardiovascular:  Negative for chest pain.  Gastrointestinal:  Negative for abdominal pain, constipation, diarrhea, nausea and vomiting.  Genitourinary:  Negative for dysuria, frequency and urgency.  Musculoskeletal:  Negative for arthralgias, back pain and myalgias.  Neurological:  Negative for dizziness and headaches.  Psychiatric/Behavioral:  Negative for agitation and sleep disturbance. The patient is not nervous/anxious.     Current Outpatient Medications on File Prior to Visit  Medication Sig Dispense Refill   escitalopram (LEXAPRO) 20 MG tablet Take 0.5 tablets (10 mg total) by mouth daily. 90 tablet 1   Multiple Vitamin (MULTIVITAMIN PO) Take by mouth daily.     VITAMIN D PO Take by mouth daily.     No current facility-administered medications on file prior to visit.   Past Medical History:  Diagnosis Date   Anxiety    Hyperlipidemia    Plantar fasciitis  Prediabetes    Past Surgical History:  Procedure Laterality Date   ABLATION  02/03/2007   CERVICAL SPINE SURGERY  2013    Family History  Problem Relation Age of Onset   Ovarian cancer Other        Maternal Great Aunt   Sleep apnea Neg Hx    Breast cancer Neg Hx    Social History   Socioeconomic History   Marital status: Divorced    Spouse name: Not on file   Number of children: Not on file   Years of education: Not on file   Highest education level: Not on file  Occupational History   Not on file  Tobacco Use   Smoking status: Never   Smokeless tobacco: Never  Vaping Use   Vaping status: Never Used  Substance and Sexual Activity   Alcohol use: Yes    Comment: rarely   Drug use: Never   Sexual activity: Not Currently    Birth control/protection: Surgical    Comment: ablation  Other Topics Concern   Not on file   Social History Narrative   Not on file   Social Determinants of Health   Financial Resource Strain: Low Risk  (01/22/2023)   Overall Financial Resource Strain (CARDIA)    Difficulty of Paying Living Expenses: Not hard at all  Food Insecurity: No Food Insecurity (01/22/2023)   Hunger Vital Sign    Worried About Running Out of Food in the Last Year: Never true    Ran Out of Food in the Last Year: Never true  Transportation Needs: No Transportation Needs (01/22/2023)   PRAPARE - Administrator, Civil Service (Medical): No    Lack of Transportation (Non-Medical): No  Physical Activity: Sufficiently Active (05/07/2023)   Exercise Vital Sign    Days of Exercise per Week: 7 days    Minutes of Exercise per Session: 60 min  Recent Concern: Physical Activity - Inactive (05/07/2023)   Exercise Vital Sign    Days of Exercise per Week: 0 days    Minutes of Exercise per Session: 60 min  Stress: No Stress Concern Present (01/22/2023)   [employer] of Occupational Health - Occupational Stress Questionnaire    Feeling of Stress : Not at all  Social Connections: Socially Isolated (01/22/2023)   Social Connection and Isolation Panel [NHANES]    Frequency of Communication with Friends and Family: Three times a week    Frequency of Social Gatherings with Friends and Family: Three times a week    Attends Religious Services: Never    Active Member of Clubs or Organizations: No    Attends Banker Meetings: Never    Marital Status: Divorced    Objective:  BP 110/72 (BP Location: Left Arm, Patient Position: Sitting, Cuff Size: Normal)   Pulse 71   Temp (!) 97.1 F (36.2 C) (Temporal)   Ht 5' 5.5" (1.664 m)   Wt 180 lb 12.8 oz (82 kg)   LMP  (LMP Unknown)   SpO2 97%   BMI 29.63 kg/m      08/16/2023    7:28 AM 05/07/2023    9:33 AM 01/22/2023    3:35 PM  BP/Weight  Systolic BP 110 110 116  Diastolic BP 72 70 82  Wt. (Lbs) 180.8 204 207  BMI 29.63 kg/m2 33.43 kg/m2  33.92 kg/m2    Physical Exam Vitals reviewed.  Constitutional:      Appearance: Normal appearance.  Cardiovascular:     Rate  and Rhythm: Normal rate and regular rhythm.     Heart sounds: Normal heart sounds.  Pulmonary:     Effort: Pulmonary effort is normal.     Breath sounds: Normal breath sounds.  Abdominal:     General: Bowel sounds are normal.     Palpations: Abdomen is soft.     Tenderness: There is no abdominal tenderness.  Neurological:     Mental Status: She is alert and oriented to person, place, and time.  Psychiatric:        Mood and Affect: Mood normal.        Behavior: Behavior normal.     Diabetic Foot Exam - Simple   No data filed      Lab Results  Component Value Date   WBC 6.0 05/07/2023   HGB 12.3 05/07/2023   HCT 38.1 05/07/2023   PLT 252 05/07/2023   GLUCOSE 104 (H) 05/07/2023   CHOL 148 05/07/2023   TRIG 60 05/07/2023   HDL 55 05/07/2023   LDLCALC 81 05/07/2023   ALT 13 05/07/2023   AST 18 05/07/2023   NA 141 05/07/2023   K 5.2 05/07/2023   CL 104 05/07/2023   CREATININE 1.04 (H) 05/07/2023   BUN 15 05/07/2023   CO2 23 05/07/2023   TSH 1.090 01/22/2023   HGBA1C 6.1 (H) 05/07/2023      Assessment & Plan:    Prediabetes Assessment & Plan: Well controlled.  Continue to work on eating a healthy diet and exercise.  Labs drawn today.   Controlled with diet and exercise Will adjust medication as needed depending on labs Lab Results  Component Value Date   HGBA1C 6.1 (H) 05/07/2023   HGBA1C 6.4 (H) 01/22/2023   HGBA1C 6.0 (H) 06/06/2022      Orders: -     CBC with Differential/Platelet -     Comprehensive metabolic panel -     Hemoglobin A1c  GAD (generalized anxiety disorder) Assessment & Plan: Controlled Denies any worsening or changing symptoms Continue taking Klonopin .5mg  as needed Will adjust treatment if needed  Orders: -     clonazePAM; Take 1 tablet (0.5 mg total) by mouth daily.  Dispense: 30 tablet; Refill:  0  Moderate binge-eating disorder Assessment & Plan: Uncontrolled Wants to try the vyvanse to help Sent to a different pharmacy to try and get it    Depression, major, recurrent, mild (HCC) Assessment & Plan: Controlled Denies any worsening or changing symptoms Continue taking the Lexapro 20mg  Will adjust treatment as needed   Mixed hyperlipidemia Assessment & Plan: Well controlled.  Continue to work on eating a healthy diet and exercise.  Labs drawn today.   Controlled with diet and exercise Will adjust treatment as needed depending on labs Lab Results  Component Value Date   LDLCALC 81 05/07/2023     Orders: -     CBC with Differential/Platelet -     Lipid panel  Binge eating disorder Assessment & Plan: Uncontrolled Wants to try the vyvanse to help Sent to a different pharmacy to try and get it   Orders: -     Lisdexamfetamine Dimesylate; Take 1 capsule (30 mg total) by mouth daily.  Dispense: 30 capsule; Refill: 0  Nasal congestion -     Mometasone Furoate; Place 2 sprays into the nose daily.  Dispense: 17 each; Refill: 2  Encounter for immunization -     Influenza, MDCK, trivalent, PF(Flucelvax egg-free) Best boy Vaccine 14yrs &  older     Meds ordered this encounter  Medications   lisdexamfetamine (VYVANSE) 30 MG capsule    Sig: Take 1 capsule (30 mg total) by mouth daily.    Dispense:  30 capsule    Refill:  0   clonazePAM (KLONOPIN) 0.5 MG tablet    Sig: Take 1 tablet (0.5 mg total) by mouth daily.    Dispense:  30 tablet    Refill:  0    This request is for a new prescription for a controlled substance as required by Federal/State law.   mometasone (NASONEX) 50 MCG/ACT nasal spray    Sig: Place 2 sprays into the nose daily.    Dispense:  17 each    Refill:  2    Orders Placed This Encounter  Procedures   Influenza, MDCK, trivalent, PF(Flucelvax egg-free)   Pfizer Comirnaty Covid-19 Vaccine 58yrs & older   CBC with  Differential/Platelet   Comprehensive metabolic panel   Hemoglobin A1c   Lipid panel     Follow-up: No follow-ups on file.   I,Marla I Leal-Borjas,acting as a scribe for [employer], PA.,have documented all relevant documentation on the behalf of Langley Gauss, PA,as directed by  Langley Gauss, PA while in the presence of Langley Gauss, [place].   An After Visit Summary was printed and given to the patient.  Langley Gauss, [place] [facility] [phone]

## 2023-08-16 ENCOUNTER — Encounter: Payer: Self-pay | Admitting: Physician Assistant

## 2023-08-16 ENCOUNTER — Ambulatory Visit: Payer: BC Managed Care – PPO | Admitting: Physician Assistant

## 2023-08-16 VITALS — BP 110/72 | HR 71 | Temp 97.1°F | Ht 65.5 in | Wt 180.8 lb

## 2023-08-16 DIAGNOSIS — F411 Generalized anxiety disorder: Secondary | ICD-10-CM

## 2023-08-16 DIAGNOSIS — E782 Mixed hyperlipidemia: Secondary | ICD-10-CM

## 2023-08-16 DIAGNOSIS — F50811 Binge eating disorder, moderate: Secondary | ICD-10-CM

## 2023-08-16 DIAGNOSIS — R7303 Prediabetes: Secondary | ICD-10-CM

## 2023-08-16 DIAGNOSIS — Z23 Encounter for immunization: Secondary | ICD-10-CM

## 2023-08-16 DIAGNOSIS — R0981 Nasal congestion: Secondary | ICD-10-CM

## 2023-08-16 DIAGNOSIS — F330 Major depressive disorder, recurrent, mild: Secondary | ICD-10-CM

## 2023-08-16 LAB — CBC WITH DIFFERENTIAL/PLATELET
Basophils Absolute: 0.1 10*3/uL (ref 0.0–0.2)
Basos: 1 %
EOS (ABSOLUTE): 0.9 10*3/uL — ABNORMAL HIGH (ref 0.0–0.4)
Eos: 14 %
Hematocrit: 40.7 % (ref 34.0–46.6)
Hemoglobin: 13 g/dL (ref 11.1–15.9)
Immature Grans (Abs): 0 10*3/uL (ref 0.0–0.1)
Immature Granulocytes: 0 %
Lymphocytes Absolute: 1.8 10*3/uL (ref 0.7–3.1)
Lymphs: 28 %
MCH: 28.6 pg (ref 26.6–33.0)
MCHC: 31.9 g/dL (ref 31.5–35.7)
MCV: 90 fL (ref 79–97)
Monocytes Absolute: 0.5 10*3/uL (ref 0.1–0.9)
Monocytes: 8 %
Neutrophils Absolute: 3.2 10*3/uL (ref 1.4–7.0)
Neutrophils: 49 %
Platelets: 247 10*3/uL (ref 150–450)
RBC: 4.54 x10E6/uL (ref 3.77–5.28)
RDW: 14.3 % (ref 11.7–15.4)
WBC: 6.6 10*3/uL (ref 3.4–10.8)

## 2023-08-16 LAB — COMPREHENSIVE METABOLIC PANEL
ALT: 13 [IU]/L (ref 0–32)
AST: 15 [IU]/L (ref 0–40)
Albumin: 4.3 g/dL (ref 3.9–4.9)
Alkaline Phosphatase: 81 [IU]/L (ref 44–121)
BUN/Creatinine Ratio: 16 (ref 12–28)
BUN: 14 mg/dL (ref 8–27)
Bilirubin Total: <0.2 mg/dL (ref 0.0–1.2)
CO2: 24 mmol/L (ref 20–29)
Calcium: 9.5 mg/dL (ref 8.7–10.3)
Chloride: 105 mmol/L (ref 96–106)
Creatinine, Ser: 0.9 mg/dL (ref 0.57–1.00)
Globulin, Total: 2.4 g/dL (ref 1.5–4.5)
Glucose: 113 mg/dL — ABNORMAL HIGH (ref 70–99)
Potassium: 5.6 mmol/L — ABNORMAL HIGH (ref 3.5–5.2)
Sodium: 142 mmol/L (ref 134–144)
Total Protein: 6.7 g/dL (ref 6.0–8.5)
eGFR: 73 mL/min/{1.73_m2} (ref >59)

## 2023-08-16 LAB — LIPID PANEL
Chol/HDL Ratio: 3.7 {ratio} (ref 0.0–4.4)
Cholesterol, Total: 205 mg/dL — ABNORMAL HIGH (ref 100–199)
HDL: 56 mg/dL (ref >39)
LDL Chol Calc (NIH): 129 mg/dL — ABNORMAL HIGH (ref 0–99)
Triglycerides: 112 mg/dL (ref 0–149)
VLDL Cholesterol Cal: 20 mg/dL (ref 5–40)

## 2023-08-16 LAB — HEMOGLOBIN A1C
Est. average glucose Bld gHb Est-mCnc: 131 mg/dL
Hgb A1c MFr Bld: 6.2 % — ABNORMAL HIGH (ref 4.8–5.6)

## 2023-08-16 MED ORDER — LISDEXAMFETAMINE DIMESYLATE 30 MG PO CAPS
30.0000 mg | ORAL_CAPSULE | Freq: Every day | ORAL | 0 refills | Status: DC
Start: 2023-08-16 — End: 2023-09-16

## 2023-08-16 MED ORDER — MOMETASONE FUROATE 50 MCG/ACT NA SUSP
2.0000 | Freq: Every day | NASAL | 2 refills | Status: DC
Start: 2023-08-16 — End: 2024-05-19

## 2023-08-16 MED ORDER — CLONAZEPAM 0.5 MG PO TABS
0.5000 mg | ORAL_TABLET | Freq: Every day | ORAL | 0 refills | Status: DC
Start: 2023-08-16 — End: 2023-09-16

## 2023-08-16 NOTE — Assessment & Plan Note (Signed)
Well controlled.  Continue to work on eating a healthy diet and exercise.  Labs drawn today.   Controlled with diet and exercise Will adjust treatment as needed depending on labs Lab Results  Component Value Date   LDLCALC 81 05/07/2023

## 2023-08-16 NOTE — Assessment & Plan Note (Signed)
Controlled Denies any worsening or changing symptoms Continue taking Klonopin .5mg  as needed Will adjust treatment if needed

## 2023-08-16 NOTE — Assessment & Plan Note (Signed)
Well controlled.  Continue to work on eating a healthy diet and exercise.  Labs drawn today.   Controlled with diet and exercise Will adjust medication as needed depending on labs Lab Results  Component Value Date   HGBA1C 6.1 (H) 05/07/2023   HGBA1C 6.4 (H) 01/22/2023   HGBA1C 6.0 (H) 06/06/2022

## 2023-08-16 NOTE — Assessment & Plan Note (Signed)
Controlled Denies any worsening or changing symptoms Continue taking the Lexapro 20mg  Will adjust treatment as needed

## 2023-08-16 NOTE — Assessment & Plan Note (Signed)
Uncontrolled Wants to try the vyvanse to help Sent to a different pharmacy to try and get it

## 2023-09-04 ENCOUNTER — Other Ambulatory Visit: Payer: Self-pay | Admitting: Family Medicine

## 2023-09-04 DIAGNOSIS — Z1231 Encounter for screening mammogram for malignant neoplasm of breast: Secondary | ICD-10-CM

## 2023-09-16 ENCOUNTER — Other Ambulatory Visit: Payer: Self-pay

## 2023-09-16 DIAGNOSIS — F330 Major depressive disorder, recurrent, mild: Secondary | ICD-10-CM

## 2023-09-16 DIAGNOSIS — F50811 Binge eating disorder, moderate: Secondary | ICD-10-CM

## 2023-09-16 DIAGNOSIS — F411 Generalized anxiety disorder: Secondary | ICD-10-CM

## 2023-09-16 MED ORDER — CLONAZEPAM 0.5 MG PO TABS
0.5000 mg | ORAL_TABLET | Freq: Every day | ORAL | 0 refills | Status: DC
Start: 2023-09-16 — End: 2023-12-11

## 2023-09-16 MED ORDER — ESCITALOPRAM OXALATE 20 MG PO TABS: 10.0000 mg | ORAL_TABLET | Freq: Every day | ORAL | 1 refills | Status: DC

## 2023-09-16 MED ORDER — LISDEXAMFETAMINE DIMESYLATE 30 MG PO CAPS: 30.0000 mg | ORAL_CAPSULE | Freq: Every day | ORAL | 0 refills | Status: DC

## 2023-10-01 ENCOUNTER — Telehealth: Payer: Self-pay | Admitting: Family Medicine

## 2023-10-01 NOTE — Telephone Encounter (Signed)
Pt is needing  a call back regarding her medication escitalopram (LEXAPRO) 20 MG tablet  she needs to know more information regarding this medication regarding the dosage .

## 2023-10-02 ENCOUNTER — Other Ambulatory Visit: Payer: Self-pay

## 2023-10-09 ENCOUNTER — Telehealth: Payer: Self-pay | Admitting: Family Medicine

## 2023-10-09 NOTE — Telephone Encounter (Signed)
Patient made aware.

## 2023-10-09 NOTE — Telephone Encounter (Deleted)
   Copied from CRM [id number]. Topic: Clinical - Medication Question >> Oct 09, 2023  1:57 PM Clayton Bibles wrote: Reason for CRM:  PT would like to talk to a nurse or doctor about her Lexapro and Vyvanse. How to take??? Call back # [phone]

## 2023-10-09 NOTE — Telephone Encounter (Addendum)
Called patient back left voicemail to call office back

## 2023-10-09 NOTE — Telephone Encounter (Signed)
Patient called with concerns her taking the lexapro with the vyvanse read the medication shouldn't be taken together, stated She has started tapering down on the lexapro, and feels like she can stop it. She has been feeling way better on the vyvanse and it is helping with her binge eating as well. Wanted provider advise on stopping the lexapro.  Copied from CRM [id number]. Topic: Clinical - Medication Question >> Oct 09, 2023  1:57 PM Clayton Bibles wrote: Reason for CRM:  PT would like to talk to a nurse or doctor about her Lexapro and Vyvanse. How to take??? Call back # [phone]

## 2023-10-09 NOTE — Telephone Encounter (Signed)
Patient is wanting a call back regarding her medications she from dr [facility] nurse

## 2023-10-21 ENCOUNTER — Other Ambulatory Visit: Payer: Self-pay

## 2023-10-21 DIAGNOSIS — F50811 Binge eating disorder, moderate: Secondary | ICD-10-CM

## 2023-10-21 MED ORDER — LISDEXAMFETAMINE DIMESYLATE 30 MG PO CAPS: 30.0000 mg | ORAL_CAPSULE | Freq: Every day | ORAL | 0 refills | Status: DC

## 2023-10-21 NOTE — Telephone Encounter (Signed)
Copied from CRM [id number]. Topic: Clinical - Medication Refill >> Oct 21, 2023  4:14 PM Almira Coaster wrote: Most Recent Primary Care Visit:  Provider: Langley Gauss  Department: [facility]  Visit Type: OFFICE VISIT  Date: 08/16/2023  Medication: lisdexamfetamine (VYVANSE) 30 MG capsule  Has the patient contacted their pharmacy? Yes (Agent: If no, request that the patient contact the pharmacy for the refill. If patient does not wish to contact the pharmacy document the reason why and proceed with request.) (Agent: If yes, when and what did the pharmacy advise?)  Is this the correct pharmacy for this prescription? Yes If no, delete pharmacy and type the correct one.  This is the patient's preferred pharmacy:  [id number] - [place], [place] - [address] AT [facility] [address] [place] [address] Phone: [phone] Fax: [phone]     Has the prescription been filled recently? No  Is the patient out of the medication? No, she has a weeks supply.   Has the patient been seen for an appointment in the last year OR does the patient have an upcoming appointment? Yes  Can we respond through MyChart? Yes  Agent: Please be advised that Rx refills may take up to 3 business days. We ask that you follow-up with your pharmacy.

## 2023-10-22 ENCOUNTER — Telehealth: Payer: Self-pay

## 2023-10-22 NOTE — Telephone Encounter (Signed)
Copied from CRM [id number]. Topic: Clinical - Medical Advice >> Oct 21, 2023  4:17 PM Almira Coaster wrote: Reason for CRM: Right Hand swelling, bump on her right wrist. She does not know what kind of specialist she would need to see. She wants to avoid paying multiple co-payments and wants to know if you can recommended a specialist.

## 2023-10-22 NOTE — Telephone Encounter (Signed)
Called patient left message for patient to office call back

## 2023-10-24 ENCOUNTER — Ambulatory Visit: Payer: BC Managed Care – PPO

## 2023-10-24 VITALS — BP 92/64 | HR 85 | Temp 97.9°F | Resp 16 | Ht 65.5 in | Wt 176.0 lb

## 2023-10-24 DIAGNOSIS — M67431 Ganglion, right wrist: Secondary | ICD-10-CM | POA: Diagnosis not present

## 2023-10-24 DIAGNOSIS — M79641 Pain in right hand: Secondary | ICD-10-CM

## 2023-10-24 NOTE — Assessment & Plan Note (Signed)
Ganglion Cyst Ganglion cyst on the dorsum of the right wrist, not currently causing significant pain or nerve compression. Ganglion cysts are common and tend to recur even after drainage. Surgical removal is only considered if the cyst becomes large, painful, or causes nerve compression. - Monitor the cyst for changes in size or pain - Consider surgical removal if the cyst becomes large, painful, or causes nerve compression.

## 2023-10-24 NOTE — Progress Notes (Signed)
Acute Office Visit  Subjective:    Patient ID: Lindsay Gregory, female    DOB: 1960-12-23, 62 y.o.   MRN: [id number]  Chief Complaint  Patient presents with   Joint Swelling   Hand Pain    Discussed the use of AI scribe software for clinical note transcription with the patient, who gave verbal consent to proceed.   Patient presents with hand pain and swelling in the knuckle area for a few weeks. Marland Kitchen  HPI: The patient presents with a chief complaint of swelling and stiffness in the RIGHT HAND  which began a couple of weeks ago after a period of increased physical activity. The patient describes difficulty in stretching the fingers of right hand. The stiffness is not limited to the morning and persists throughout the day,  The patient denies taking any pain medication and reports no impact on daily activities such as cooking, eating, and farming.  The patient also reports a noticeable bump on the right hand, identified as a ganglion cyst, which occasionally causes discomfort. The patient has not sought any treatment for this cyst. The patient also notes a difficulty in fully extending the right hand, particularly the middle finger, which is also associated with tenderness at the base of the joint. The patient denies any pain at the base of the thumb and there is no apparent muscle wasting in the hand. Despite the discomfort, the patient is able to make a strong grasp with the right hand.  The patient has a family history of similar hand characteristics, with prominent veins, as seen in her father. The patient has previously received a steroid injection in the foot for a similar issue. The patient does not take any regular medication for the pain and has not used a brace or received an injection for the current issue.  Past Medical History:  Diagnosis Date   Anxiety    Hyperlipidemia    Plantar fasciitis    Prediabetes     Past Surgical History:  Procedure Laterality Date   ABLATION   02/03/2007   CERVICAL SPINE SURGERY  2013    Family History  Problem Relation Age of Onset   Ovarian cancer Other        Maternal Great Aunt   Sleep apnea Neg Hx    Breast cancer Neg Hx     Social History   Socioeconomic History   Marital status: Divorced    Spouse name: Not on file   Number of children: Not on file   Years of education: Not on file   Highest education level: Not on file  Occupational History   Not on file  Tobacco Use   Smoking status: Never   Smokeless tobacco: Never  Vaping Use   Vaping status: Never Used  Substance and Sexual Activity   Alcohol use: Yes    Comment: rarely   Drug use: Never   Sexual activity: Not Currently    Birth control/protection: Surgical    Comment: ablation  Other Topics Concern   Not on file  Social History Narrative   Not on file   Social Drivers of Health   Financial Resource Strain: Low Risk  (01/22/2023)   Overall Financial Resource Strain (CARDIA)    Difficulty of Paying Living Expenses: Not hard at all  Food Insecurity: No Food Insecurity (01/22/2023)   Hunger Vital Sign    Worried About Running Out of Food in the Last Year: Never true    Ran Out of Food  in the Last Year: Never true  Transportation Needs: No Transportation Needs (01/22/2023)   PRAPARE - Administrator, Civil Service (Medical): No    Lack of Transportation (Non-Medical): No  Physical Activity: Sufficiently Active (05/07/2023)   Exercise Vital Sign    Days of Exercise per Week: 7 days    Minutes of Exercise per Session: 60 min  Recent Concern: Physical Activity - Inactive (05/07/2023)   Exercise Vital Sign    Days of Exercise per Week: 0 days    Minutes of Exercise per Session: 60 min  Stress: No Stress Concern Present (01/22/2023)   [employer] of Occupational Health - Occupational Stress Questionnaire    Feeling of Stress : Not at all  Social Connections: Socially Isolated (01/22/2023)   Social Connection and Isolation Panel  [NHANES]    Frequency of Communication with Friends and Family: Three times a week    Frequency of Social Gatherings with Friends and Family: Three times a week    Attends Religious Services: Never    Active Member of Clubs or Organizations: No    Attends Banker Meetings: Never    Marital Status: Divorced  Catering manager Violence: Not At Risk (01/22/2023)   Humiliation, Afraid, Rape, and Kick questionnaire    Fear of Current or Ex-Partner: No    Emotionally Abused: No    Physically Abused: No    Sexually Abused: No    Outpatient Medications Prior to Visit  Medication Sig Dispense Refill   clonazePAM (KLONOPIN) 0.5 MG tablet Take 1 tablet (0.5 mg total) by mouth daily. 30 tablet 0   escitalopram (LEXAPRO) 20 MG tablet Take 20 mg by mouth daily. AS NEEDED     lisdexamfetamine (VYVANSE) 30 MG capsule Take 1 capsule (30 mg total) by mouth daily. 30 capsule 0   mometasone (NASONEX) 50 MCG/ACT nasal spray Place 2 sprays into the nose daily. 17 each 2   Multiple Vitamin (MULTIVITAMIN PO) Take by mouth daily.     VITAMIN D PO Take by mouth daily.     escitalopram (LEXAPRO) 20 MG tablet Take 0.5 tablets (10 mg total) by mouth daily. 90 tablet 1   No facility-administered medications prior to visit.    Allergies  Allergen Reactions   Amoxicillin Hives and Itching    Review of Systems  Musculoskeletal:        Right middle finger base pain Cyst right wrist  All other systems reviewed and are negative.      Objective:        10/24/2023   11:22 AM 08/16/2023    7:28 AM 05/07/2023    9:33 AM  Vitals with BMI  Height 5' 5.5" 5' 5.5" 5' 5.5"  Weight 176 lbs 180 lbs 13 oz 204 lbs  BMI 28.83 29.62 33.42  Systolic 92 110 110  Diastolic 64 72 70  Pulse 85 71 80    Orthostatic VS for the past 72 hrs (Last 3 readings):  Patient Position BP Location Cuff Size  10/24/23 1122 Sitting Right Arm Normal     Physical Exam Vitals reviewed.  Constitutional:       Appearance: Normal appearance.  Cardiovascular:     Rate and Rhythm: Normal rate and regular rhythm.  Pulmonary:     Effort: Pulmonary effort is normal.     Breath sounds: Normal breath sounds.  Musculoskeletal:     Comments: MUSCULOSKELETAL: Dorsum of right hand exhibits mild redness on the knuckle of  the right middle  finger. Mild discomfort with ROM at the 3rd metacarpophalangeal joint.  Ganglion cyst observed on the dorsum of the right wrist.    No pain at the base of the thumb. Right hand exhibits stiffness and reduced extensibility, with reported pain upon forced extension. No wasting of the muscles of the hand observed.  Neurological:     Mental Status: She is alert.     Health Maintenance Due  Topic Date Due   Zoster Vaccines- Shingrix (2 of 2) 07/02/2023    There are no preventive care reminders to display for this patient.   Lab Results  Component Value Date   TSH 1.090 01/22/2023   Lab Results  Component Value Date   WBC 6.6 08/16/2023   HGB 13.0 08/16/2023   HCT 40.7 08/16/2023   MCV 90 08/16/2023   PLT 247 08/16/2023   Lab Results  Component Value Date   NA 142 08/16/2023   K 5.6 (H) 08/16/2023   CO2 24 08/16/2023   GLUCOSE 113 (H) 08/16/2023   BUN 14 08/16/2023   CREATININE 0.90 08/16/2023   BILITOT <0.2 08/16/2023   ALKPHOS 81 08/16/2023   AST 15 08/16/2023   ALT 13 08/16/2023   PROT 6.7 08/16/2023   ALBUMIN 4.3 08/16/2023   CALCIUM 9.5 08/16/2023   EGFR 73 08/16/2023   Lab Results  Component Value Date   CHOL 205 (H) 08/16/2023   Lab Results  Component Value Date   HDL 56 08/16/2023   Lab Results  Component Value Date   LDLCALC 129 (H) 08/16/2023   Lab Results  Component Value Date   TRIG 112 08/16/2023   Lab Results  Component Value Date   CHOLHDL 3.7 08/16/2023   Lab Results  Component Value Date   HGBA1C 6.2 (H) 08/16/2023       Assessment & Plan:  Right hand pain Assessment & Plan: Presents with stiffness and  difficulty extending the fingers, particularly the right middle finger, for a couple of weeks. No significant pain but noticeable swelling and difficulty gripping. Examination reveals redness on the dorsum of the right hand, a ganglion cyst on the dorsum of the right wrist, tenderness at the base of the middle finger, and tightness in the tendons. Differential diagnosis includes tendon contracture or trigger finger. Lack of daily pain medication use suggests no immediate need for a steroid injection. If symptoms persist despite conservative measures, an x-ray may be warranted to evaluate for arthritis, and a steroid injection may be considered for tendon contracture or trigger finger. - Apply heat to the affected area - Perform hand and finger exercises using a therapy ball - Take ibuprofen as needed for pain - Reassess in 2-3 weeks - Consider x-ray if no improvement to evaluate for arthritis - Consider steroid injection if symptoms persist    Ganglion cyst of dorsum of right wrist Assessment & Plan: Ganglion Cyst Ganglion cyst on the dorsum of the right wrist, not currently causing significant pain or nerve compression. Ganglion cysts are common and tend to recur even after drainage. Surgical removal is only considered if the cyst becomes large, painful, or causes nerve compression. - Monitor the cyst for changes in size or pain - Consider surgical removal if the cyst becomes large, painful, or causes nerve compression.      No orders of the defined types were placed in this encounter.   No orders of the defined types were placed in this encounter.    Follow-up: Return if symptoms worsen or fail  to improve.  An After Visit Summary was printed and given to the patient.  Windell Moment, MD [facility] [phone]

## 2023-10-24 NOTE — Patient Instructions (Signed)
VISIT SUMMARY:  You came in today with concerns about swelling and stiffness in your hands and knees, which started a couple of weeks ago after increased physical activity. You also mentioned a bump on your right hand, identified as a ganglion cyst, and difficulty extending your right hand, especially the middle finger. We discussed your symptoms and family history, and I provided recommendations for managing your conditions.  YOUR PLAN:  -HAND AND FINGER STIFFNESS: This condition involves difficulty in moving your fingers and hand, particularly the right hand, due to swelling and tightness in the tendons. To manage this, apply heat to the affected area, perform hand and finger exercises using a therapy ball, and take ibuprofen as needed for pain. We will reassess in 2-3 weeks, and if there is no improvement, we may consider an x-ray to check for arthritis or a steroid injection if necessary.  -GANGLION CYST: A ganglion cyst is a non-cancerous lump that often develops along the tendons or joints of your wrists or hands. Your cyst is not currently causing significant pain or nerve issues. We will monitor the cyst for any changes in size or pain. Surgical removal will only be considered if it becomes large, painful, or causes nerve compression.  INSTRUCTIONS:  Please follow up in 2-3 weeks to reassess your hand and finger stiffness. If your symptoms do not improve, we may need to perform an x-ray or consider a steroid injection.

## 2023-10-24 NOTE — Assessment & Plan Note (Signed)
Presents with stiffness and difficulty extending the fingers, particularly the right middle finger, for a couple of weeks. No significant pain but noticeable swelling and difficulty gripping. Examination reveals redness on the dorsum of the right hand, a ganglion cyst on the dorsum of the right wrist, tenderness at the base of the middle finger, and tightness in the tendons. Differential diagnosis includes tendon contracture or trigger finger. Lack of daily pain medication use suggests no immediate need for a steroid injection. If symptoms persist despite conservative measures, an x-ray may be warranted to evaluate for arthritis, and a steroid injection may be considered for tendon contracture or trigger finger. - Apply heat to the affected area - Perform hand and finger exercises using a therapy ball - Take ibuprofen as needed for pain - Reassess in 2-3 weeks - Consider x-ray if no improvement to evaluate for arthritis - Consider steroid injection if symptoms persist

## 2023-12-03 ENCOUNTER — Ambulatory Visit: Payer: BC Managed Care – PPO | Admitting: Family Medicine

## 2023-12-11 ENCOUNTER — Other Ambulatory Visit: Payer: Self-pay | Admitting: Family Medicine

## 2023-12-11 DIAGNOSIS — F411 Generalized anxiety disorder: Secondary | ICD-10-CM

## 2023-12-11 DIAGNOSIS — F50811 Binge eating disorder, moderate: Secondary | ICD-10-CM

## 2023-12-11 MED ORDER — LISDEXAMFETAMINE DIMESYLATE 30 MG PO CAPS: 30.0000 mg | ORAL_CAPSULE | Freq: Every day | ORAL | 0 refills | Status: DC

## 2023-12-11 MED ORDER — CLONAZEPAM 0.5 MG PO TABS: 0.5000 mg | ORAL_TABLET | Freq: Every day | ORAL | 2 refills | Status: DC

## 2023-12-11 NOTE — Telephone Encounter (Signed)
Copied from CRM [id number]. Topic: Clinical - Medication Refill >> Dec 11, 2023 11:43 AM Clayton Bibles wrote: Most Recent Primary Care Visit:  Provider: Windell Moment  Department: [facility]  Visit Type: ACUTE  Date: 10/24/2023  Medication: lisdexamfetamine (VYVANSE) 30 MG capsule AND clonazePAM (KLONOPIN) 0.5 MG tablet   Has the patient contacted their pharmacy? No (Agent: If no, request that the patient contact the pharmacy for the refill. If patient does not wish to contact the pharmacy document the reason why and proceed with request.) (Agent: If yes, when and what did the pharmacy advise?) N/A  Is this the correct pharmacy for this prescription? Yes [facility]  [address] If no, delete pharmacy and type the correct one.  This is the patient's preferred pharmacy:  [id number] - [place], [place] - [address] AT [facility] [address] [place] [address] Phone: [phone] Fax: [phone]  [id number] - [place], [place] - [address] AT [facility] [address] [facility] [address] [place] [address] Phone: [phone] Fax: [phone]   Has the prescription been filled recently? No  Is the patient out of the medication? No she has 5 pills left  Has the patient been seen for an appointment in the last year OR does the patient have an upcoming appointment? Yes  Can we respond through MyChart? Yes - Send a message when completed  Agent: Please be advised that Rx refills may take up to 3 business days. We ask that you follow-up with your pharmacy.

## 2024-01-08 ENCOUNTER — Ambulatory Visit: Payer: BC Managed Care – PPO | Admitting: Family Medicine

## 2024-02-23 NOTE — Progress Notes (Unsigned)
 Subjective:  Patient ID: Lindsay Gregory, female    DOB: 05-Mar-1961  Age: 63 y.o. MRN: [id number]  Chief Complaint  Patient presents with   New Patient (Initial Visit)   HPI: Anxiety/Depression: Patient is taking Klonopin  0.5 mg once daily as needed, Lexapro  10 mg daily.   Binge eating: She was on vyvanse  30 mg but it caused increased anxiety so she had to stop it.   Apnea: Uses CPAP every night. Helping.   Prediabetes: See's dietician. A1c 6.2.   Eating healthy and exercising.     02/24/2024    2:04 PM 08/16/2023    7:32 AM 05/07/2023    9:40 AM 01/22/2023    3:37 PM 06/14/2022    3:16 PM  Depression screen PHQ 2/9  Decreased Interest 0 0 1 0 0  Down, Depressed, Hopeless 1 0 1 1 0  PHQ - 2 Score 1 0 2 1 0  Altered sleeping 0 0 0 0 0  Tired, decreased energy 1 0 1 0 2  Change in appetite 3 3 1  0 2  Feeling bad or failure about yourself  1 1 0 0 0  Trouble concentrating 0 0 0 0 0  Moving slowly or fidgety/restless 0 0 0 0 0  Suicidal thoughts 0 0 0 0 0  PHQ-9 Score 6 4 4 1 4   Difficult doing work/chores Somewhat difficult Not difficult at all Somewhat difficult Not difficult at all Not difficult at all        02/24/2024    2:04 PM  Fall Risk   Falls in the past year? 0  Number falls in past yr: 0  Injury with Fall? 0  Follow up Falls evaluation completed    Patient Care Team: Mercy Stall, MD as PCP - General (Family Medicine) Mercy Stall, MD as Referring Physician (Family Medicine) Andee Bamberger, NP as Nurse Practitioner (Gynecology)   Review of Systems  Constitutional:  Negative for chills, fatigue and fever.  HENT:  Negative for congestion, ear pain, rhinorrhea and sore throat.   Respiratory:  Negative for cough and shortness of breath.   Cardiovascular:  Negative for chest pain.  Gastrointestinal:  Negative for abdominal pain, constipation, diarrhea, nausea and vomiting.  Genitourinary:  Negative for dysuria and urgency.  Musculoskeletal:  Negative  for back pain and myalgias.  Neurological:  Negative for dizziness, weakness, light-headedness and headaches.  Psychiatric/Behavioral:  Negative for dysphoric mood. The patient is not nervous/anxious.     Current Outpatient Medications on File Prior to Visit  Medication Sig Dispense Refill   clonazePAM  (KLONOPIN ) 0.5 MG tablet Take 1 tablet (0.5 mg total) by mouth daily. 30 tablet 2   escitalopram  (LEXAPRO ) 20 MG tablet Take 10 mg by mouth daily. AS NEEDED     mometasone  (NASONEX ) 50 MCG/ACT nasal spray Place 2 sprays into the nose daily. 17 each 2   Multiple Vitamin (MULTIVITAMIN PO) Take by mouth daily.     VITAMIN D  PO Take by mouth daily.     No current facility-administered medications on file prior to visit.   Past Medical History:  Diagnosis Date   Anxiety    Hyperlipidemia    Plantar fasciitis    Prediabetes    Past Surgical History:  Procedure Laterality Date   ABLATION  02/03/2007   CERVICAL SPINE SURGERY  2013    Family History  Problem Relation Age of Onset   Ovarian cancer Other        Maternal Great Aunt  Sleep apnea Neg Hx    Breast cancer Neg Hx    Social History   Socioeconomic History   Marital status: Divorced    Spouse name: Not on file   Number of children: Not on file   Years of education: Not on file   Highest education level: Not on file  Occupational History   Not on file  Tobacco Use   Smoking status: Never   Smokeless tobacco: Never  Vaping Use   Vaping status: Never Used  Substance and Sexual Activity   Alcohol use: Yes    Comment: rarely   Drug use: Never   Sexual activity: Not Currently    Birth control/protection: Surgical    Comment: ablation  Other Topics Concern   Not on file  Social History Narrative   Not on file   Social Drivers of Health   Financial Resource Strain: Low Risk  (01/22/2023)   Overall Financial Resource Strain (CARDIA)    Difficulty of Paying Living Expenses: Not hard at all  Food Insecurity: No Food  Insecurity (02/24/2024)   Hunger Vital Sign    Worried About Running Out of Food in the Last Year: Never true    Ran Out of Food in the Last Year: Never true  Transportation Needs: No Transportation Needs (02/24/2024)   PRAPARE - Administrator, Civil Service (Medical): No    Lack of Transportation (Non-Medical): No  Physical Activity: Sufficiently Active (05/07/2023)   Exercise Vital Sign    Days of Exercise per Week: 7 days    Minutes of Exercise per Session: 60 min  Recent Concern: Physical Activity - Inactive (05/07/2023)   Exercise Vital Sign    Days of Exercise per Week: 0 days    Minutes of Exercise per Session: 60 min  Stress: No Stress Concern Present (01/22/2023)   [employer] of Occupational Health - Occupational Stress Questionnaire    Feeling of Stress : Not at all  Social Connections: Socially Isolated (01/22/2023)   Social Connection and Isolation Panel [NHANES]    Frequency of Communication with Friends and Family: Three times a week    Frequency of Social Gatherings with Friends and Family: Three times a week    Attends Religious Services: Never    Active Member of Clubs or Organizations: No    Attends Banker Meetings: Never    Marital Status: Divorced    Objective:  BP 128/72   Pulse 73   Temp 98.3 F (36.8 C)   Ht 5' 5.5" (1.664 m)   Wt 173 lb (78.5 kg)   SpO2 97%   BMI 28.35 kg/m      02/24/2024    1:53 PM 10/24/2023   11:22 AM 08/16/2023    7:28 AM  BP/Weight  Systolic BP 128 92 110  Diastolic BP 72 64 72  Wt. (Lbs) 173 176 180.8  BMI 28.35 kg/m2 28.84 kg/m2 29.63 kg/m2    Physical Exam  Diabetic Foot Exam - Simple   No data filed      Lab Results  Component Value Date   WBC 7.7 02/24/2024   HGB 12.7 02/24/2024   HCT 38.6 02/24/2024   PLT 266 02/24/2024   GLUCOSE 88 02/24/2024   CHOL 204 (H) 02/24/2024   TRIG 59 02/24/2024   HDL 89 02/24/2024   LDLCALC 104 (H) 02/24/2024   ALT 13 02/24/2024   AST 17  02/24/2024   NA 138 02/24/2024   K 5.1 02/24/2024  CL 101 02/24/2024   CREATININE 0.88 02/24/2024   BUN 13 02/24/2024   CO2 24 02/24/2024   TSH 1.760 02/24/2024   HGBA1C 5.9 (H) 02/24/2024      Assessment & Plan:  Assessment and Plan       Depression, major, recurrent, mild (HCC) Assessment & Plan: The current medical regimen is effective;  continue present plan and medications.  Patient is taking Klonopin  0.5 mg once daily as needed, Lexapro  10 mg daily.    GAD (generalized anxiety disorder) Assessment & Plan: The current medical regimen is effective;  continue present plan and medications.  Patient is taking Klonopin  0.5 mg once daily as needed, Lexapro  10 mg daily.     Mixed hyperlipidemia Assessment & Plan: Well controlled.  No changes to medicines.  Continue to work on eating a healthy diet and exercise.  Labs drawn today.    Orders: -     CBC with Differential/Platelet -     Comprehensive metabolic panel with GFR -     Lipid panel -     TSH  Prediabetes Assessment & Plan: Hemoglobin A1c 5.9%, 3 month avg of blood sugars, is in prediabetic range.  In order to prevent progression to diabetes, recommend low carb diet and regular exercise   Orders: -     Hemoglobin A1c  Screening for colon cancer -     Ambulatory referral to Gastroenterology  Immunization due -     Varicella-zoster vaccine IM  Moderate binge-eating disorder Assessment & Plan: Decrease dosage of vyvanse  from 30 to 20 mg. Sent Vyvanse  20 mg daily  Orders: -     Lisdexamfetamine Dimesylate ; Take 1 capsule (20 mg total) by mouth daily.  Dispense: 30 capsule; Refill: 0  Overweight with body mass index (BMI) of 28 to 28.9 in adult Assessment & Plan: Recommend continue to work on eating healthy diet and exercise.    OSA on CPAP Assessment & Plan: Continue cpap.  Benefits and is compliant.       Meds ordered this encounter  Medications   lisdexamfetamine (VYVANSE ) 20 MG  capsule    Sig: Take 1 capsule (20 mg total) by mouth daily.    Dispense:  30 capsule    Refill:  0    Orders Placed This Encounter  Procedures   Zoster Recombinant (Shingrix  )   CBC with Differential/Platelet   Comprehensive metabolic panel with GFR   Lipid panel   Hemoglobin A1c   TSH   Ambulatory referral to Gastroenterology     Follow-up: Return in about 6 months (around 08/25/2024) for chronic follow up.   I,Marla I Leal-Borjas,acting as a scribe for Mercy Stall, MD.,have documented all relevant documentation on the behalf of Mercy Stall, MD,as directed by  Mercy Stall, MD while in the presence of Mercy Stall, MD.   An After Visit Summary was printed and given to the patient.  I attest that I have reviewed this visit and agree with the plan scribed by my staff.   Mercy Stall, MD [facility] [phone]

## 2024-02-24 ENCOUNTER — Ambulatory Visit: Payer: BC Managed Care – PPO | Admitting: Family Medicine

## 2024-02-24 ENCOUNTER — Encounter: Payer: Self-pay | Admitting: Family Medicine

## 2024-02-24 VITALS — BP 128/72 | HR 73 | Temp 98.3°F | Ht 65.5 in | Wt 173.0 lb

## 2024-02-24 DIAGNOSIS — F330 Major depressive disorder, recurrent, mild: Secondary | ICD-10-CM

## 2024-02-24 DIAGNOSIS — Z23 Encounter for immunization: Secondary | ICD-10-CM | POA: Diagnosis not present

## 2024-02-24 DIAGNOSIS — F411 Generalized anxiety disorder: Secondary | ICD-10-CM

## 2024-02-24 DIAGNOSIS — R7303 Prediabetes: Secondary | ICD-10-CM | POA: Diagnosis not present

## 2024-02-24 DIAGNOSIS — Z1211 Encounter for screening for malignant neoplasm of colon: Secondary | ICD-10-CM

## 2024-02-24 DIAGNOSIS — E782 Mixed hyperlipidemia: Secondary | ICD-10-CM

## 2024-02-24 DIAGNOSIS — E663 Overweight: Secondary | ICD-10-CM

## 2024-02-24 DIAGNOSIS — Z6828 Body mass index (BMI) 28.0-28.9, adult: Secondary | ICD-10-CM

## 2024-02-24 DIAGNOSIS — F50811 Binge eating disorder, moderate: Secondary | ICD-10-CM

## 2024-02-24 DIAGNOSIS — G4733 Obstructive sleep apnea (adult) (pediatric): Secondary | ICD-10-CM

## 2024-02-24 MED ORDER — LISDEXAMFETAMINE DIMESYLATE 20 MG PO CAPS: 20.0000 mg | ORAL_CAPSULE | Freq: Every day | ORAL | 0 refills | Status: AC

## 2024-02-25 LAB — COMPREHENSIVE METABOLIC PANEL WITH GFR
ALT: 13 IU/L (ref 0–32)
AST: 17 IU/L (ref 0–40)
Albumin: 4.5 g/dL (ref 3.9–4.9)
Alkaline Phosphatase: 77 IU/L (ref 44–121)
BUN/Creatinine Ratio: 15 (ref 12–28)
BUN: 13 mg/dL (ref 8–27)
Bilirubin Total: 0.3 mg/dL (ref 0.0–1.2)
CO2: 24 mmol/L (ref 20–29)
Calcium: 9.6 mg/dL (ref 8.7–10.3)
Chloride: 101 mmol/L (ref 96–106)
Creatinine, Ser: 0.88 mg/dL (ref 0.57–1.00)
Globulin, Total: 2.5 g/dL (ref 1.5–4.5)
Glucose: 88 mg/dL (ref 70–99)
Potassium: 5.1 mmol/L (ref 3.5–5.2)
Sodium: 138 mmol/L (ref 134–144)
Total Protein: 7 g/dL (ref 6.0–8.5)
eGFR: 75 mL/min/{1.73_m2} (ref >59)

## 2024-02-25 LAB — HEMOGLOBIN A1C
Est. average glucose Bld gHb Est-mCnc: 123 mg/dL
Hgb A1c MFr Bld: 5.9 % — ABNORMAL HIGH (ref 4.8–5.6)

## 2024-02-25 LAB — LIPID PANEL
Chol/HDL Ratio: 2.3 ratio (ref 0.0–4.4)
Cholesterol, Total: 204 mg/dL — ABNORMAL HIGH (ref 100–199)
HDL: 89 mg/dL (ref >39)
LDL Chol Calc (NIH): 104 mg/dL — ABNORMAL HIGH (ref 0–99)
Triglycerides: 59 mg/dL (ref 0–149)
VLDL Cholesterol Cal: 11 mg/dL (ref 5–40)

## 2024-02-25 LAB — CBC WITH DIFFERENTIAL/PLATELET
Basophils Absolute: 0.1 10*3/uL (ref 0.0–0.2)
Basos: 1 %
EOS (ABSOLUTE): 0.7 10*3/uL — ABNORMAL HIGH (ref 0.0–0.4)
Eos: 9 %
Hematocrit: 38.6 % (ref 34.0–46.6)
Hemoglobin: 12.7 g/dL (ref 11.1–15.9)
Immature Grans (Abs): 0 10*3/uL (ref 0.0–0.1)
Immature Granulocytes: 0 %
Lymphocytes Absolute: 2.5 10*3/uL (ref 0.7–3.1)
Lymphs: 33 %
MCH: 29.2 pg (ref 26.6–33.0)
MCHC: 32.9 g/dL (ref 31.5–35.7)
MCV: 89 fL (ref 79–97)
Monocytes Absolute: 0.5 10*3/uL (ref 0.1–0.9)
Monocytes: 7 %
Neutrophils Absolute: 3.8 10*3/uL (ref 1.4–7.0)
Neutrophils: 50 %
Platelets: 266 10*3/uL (ref 150–450)
RBC: 4.35 x10E6/uL (ref 3.77–5.28)
RDW: 13.4 % (ref 11.7–15.4)
WBC: 7.7 10*3/uL (ref 3.4–10.8)

## 2024-02-25 LAB — TSH: TSH: 1.76 u[IU]/mL (ref 0.450–4.500)

## 2024-02-26 ENCOUNTER — Encounter: Payer: Self-pay | Admitting: Family Medicine

## 2024-02-28 NOTE — Assessment & Plan Note (Signed)
 The current medical regimen is effective;  continue present plan and medications.  Patient is taking Klonopin  0.5 mg once daily as needed, Lexapro  10 mg daily.

## 2024-02-28 NOTE — Assessment & Plan Note (Signed)
Well controlled.  ?No changes to medicines.  ?Continue to work on eating a healthy diet and exercise.  ?Labs drawn today.  ?

## 2024-02-28 NOTE — Assessment & Plan Note (Signed)
Hemoglobin A1c 5.9%, 3 month avg of blood sugars, is in prediabetic range.  In order to prevent progression to diabetes, recommend low carb diet and regular exercise

## 2024-02-28 NOTE — Assessment & Plan Note (Signed)
 The current medical regimen is effective;  continue present plan and medications.  Vyvanse  20 mg daily

## 2024-03-01 DIAGNOSIS — Z23 Encounter for immunization: Secondary | ICD-10-CM | POA: Insufficient documentation

## 2024-03-01 DIAGNOSIS — G4733 Obstructive sleep apnea (adult) (pediatric): Secondary | ICD-10-CM | POA: Insufficient documentation

## 2024-03-01 DIAGNOSIS — Z1211 Encounter for screening for malignant neoplasm of colon: Secondary | ICD-10-CM | POA: Insufficient documentation

## 2024-03-01 DIAGNOSIS — E663 Overweight: Secondary | ICD-10-CM | POA: Insufficient documentation

## 2024-03-01 NOTE — Assessment & Plan Note (Signed)
Continue cpap. Benefits and is compliant.   

## 2024-03-01 NOTE — Assessment & Plan Note (Signed)
 Recommend continue to work on eating healthy diet and exercise.

## 2024-03-03 ENCOUNTER — Telehealth: Payer: Self-pay

## 2024-03-03 NOTE — Telephone Encounter (Signed)
 PA submitted and approved via covermyeds for vyvanse . [id number]

## 2024-05-19 ENCOUNTER — Ambulatory Visit (INDEPENDENT_AMBULATORY_CARE_PROVIDER_SITE_OTHER): Payer: Self-pay | Admitting: Nurse Practitioner

## 2024-05-19 ENCOUNTER — Encounter: Payer: Self-pay | Admitting: Nurse Practitioner

## 2024-05-19 ENCOUNTER — Other Ambulatory Visit (HOSPITAL_COMMUNITY)
Admission: RE | Admit: 2024-05-19 | Discharge: 2024-05-19 | Disposition: A | Discharge status: Home / Self Care | Source: Ambulatory Visit | Attending: Nurse Practitioner | Admitting: Nurse Practitioner

## 2024-05-19 VITALS — BP 118/80 | HR 58 | Ht 65.0 in | Wt 190.0 lb

## 2024-05-19 DIAGNOSIS — Z124 Encounter for screening for malignant neoplasm of cervix: Secondary | ICD-10-CM

## 2024-05-19 DIAGNOSIS — Z780 Asymptomatic menopausal state: Secondary | ICD-10-CM | POA: Diagnosis not present

## 2024-05-19 DIAGNOSIS — F50811 Binge eating disorder, moderate: Secondary | ICD-10-CM

## 2024-05-19 DIAGNOSIS — Z1331 Encounter for screening for depression: Secondary | ICD-10-CM | POA: Diagnosis not present

## 2024-05-19 DIAGNOSIS — Z01419 Encounter for gynecological examination (general) (routine) without abnormal findings: Secondary | ICD-10-CM | POA: Insufficient documentation

## 2024-05-19 NOTE — Progress Notes (Signed)
 Lindsay Gregory 05/02/1961 [id number]   History:  63 y.o. G1P1001 presents for annual exam. Postmenopausal - no HRT, no bleeding. Normal pap history. Anxiety, prediabetes, binge eating disorder managed by PCP. Concerned with her weight. Binge eating has been managed with Vyvanse  in the past but it worsens her anxiety so she stopped taking. At one point she was down to 175 pounds. Today she is 190 pounds. Feels her eating has been very bad due to personal stressors with caring for dad who had back surgery, lost dog etc. Sees dietician. Sees therapist but they do not specialize in eating disorders.   Gynecologic History No LMP recorded. Patient has had an ablation.   Contraception/Family planning: post menopausal status Sexually active: No  Health Maintenance Last Pap: 05/29/2019. Results were: Normal, 5-year repeat Last mammogram: 06/19/2023. Results were: Possible right breast mass, follow up imaging confirmed cyst Last colonoscopy: Never. Cologuard 2023 Last Dexa: Not indicated   Past medical history, past surgical history, family history and social history were all reviewed and documented in the EPIC chart. Single. 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 62 yo son, married, in Social worker school. Has hobby farm.   ROS:  A ROS was performed and pertinent positives and negatives are included.  Exam:  Vitals:   05/19/24 1202  BP: 118/80  Pulse: (!) 58  SpO2: 98%  Weight: 190 lb (86.2 kg)  Height: 5' 5 (1.651 m)    Body mass index is 31.62 kg/m.  General appearance:  Normal Thyroid:  Symmetrical, normal in size, without palpable masses or nodularity. Respiratory  Auscultation:  Clear without wheezing or rhonchi Cardiovascular  Auscultation:  Regular rate, without rubs, murmurs or gallops  Edema/varicosities:  Not grossly evident Abdominal  Soft,nontender, without masses, guarding or rebound.  Liver/spleen:  No organomegaly noted  Hernia:  None appreciated   Skin  Inspection:  Grossly normal Breasts: Examined lying and sitting.   Right: Without masses, retractions, nipple discharge or axillary adenopathy.   Left: Without masses, retractions, nipple discharge or axillary adenopathy. Pelvic: External genitalia:  no lesions              Urethra:  normal appearing urethra with no masses, tenderness or lesions              Bartholins and Skenes: normal                 Vagina: normal appearing vagina with normal color and discharge, no lesions              Cervix: no lesions Bimanual Exam:  Uterus:  no masses or tenderness              Adnexa: no mass, fullness, tenderness              Rectovaginal: Deferred              Anus:  normal, no lesions  Lindsay Mole, NP student performed exam with observation.   Assessment/Plan:  63 y.o. G1P1001 for annual exam.   Well female exam with routine gynecological exam - Education provided on SBEs, importance of preventative screenings, current guidelines, high calcium diet, regular exercise, and multivitamin daily.  Labs with PCP.   Postmenopausal - no HRT, no bleeding.   Cervical cancer screening - Plan: Cytology - PAP( [facility]). Normal pap history.   Moderate binge-eating disorder - feels is has not been under control. Recommended therapist who specialize in eating disorders. She is considering restating  Vyvanse  but worries about her anxiety.   Screening for breast cancer - Benign cysts 06/2023. One year follow up recommended. Schedule in August. Normal breast exam today.  Screening for colon cancer - Has consultation next week for a colonoscopy.   Screening for osteoporosis - Average risk. Will plan DXA at age 63.   Return in about 1 year (around 05/19/2025) for Annual.    Lindsay DELENA Shutter DNP, 1:33 PM 05/19/2024

## 2024-05-19 NOTE — Patient Instructions (Addendum)
 [email or web address]   [email or web address]

## 2024-05-22 ENCOUNTER — Ambulatory Visit: Payer: Self-pay | Admitting: Nurse Practitioner

## 2024-05-22 LAB — CYTOLOGY - PAP
Comment: NEGATIVE
Diagnosis: NEGATIVE
High risk HPV: NEGATIVE

## 2024-05-26 ENCOUNTER — Encounter: Payer: Self-pay | Admitting: Pediatrics

## 2024-06-11 ENCOUNTER — Ambulatory Visit (AMBULATORY_SURGERY_CENTER)

## 2024-06-11 ENCOUNTER — Encounter: Payer: Self-pay | Admitting: Pediatrics

## 2024-06-11 VITALS — Ht 65.0 in | Wt 188.0 lb

## 2024-06-11 DIAGNOSIS — Z1211 Encounter for screening for malignant neoplasm of colon: Secondary | ICD-10-CM

## 2024-06-11 MED ORDER — NA SULFATE-K SULFATE-MG SULF 17.5-3.13-1.6 GM/177ML PO SOLN: 1.0000 | Freq: Once | ORAL | 0 refills | Status: AC

## 2024-06-11 NOTE — Progress Notes (Signed)
 Pre visit completed via phone call; Patient verified name, DOB, and address; No egg or soy allergy known to patient;  No issues known to pt with past sedation with any surgeries or procedures; Patient denies ever being told they had issues or difficulty with intubation;  No FH of Malignant Hyperthermia; Pt is not on diet pills; Pt is not on home 02;  Pt is not on blood thinners;  Pt denies issues with constipation;  No A fib or A flutter Have any cardiac testing pending--NO Insurance verified during PV appt--- [facility] Pt can ambulate without assistance;  Pt denies use of chewing tobacco; Discussed diabetic/weight loss medication holds; Discussed NSAID holds; Checked BMI to be less than 50; Pt instructed to use Singlecare.com or GoodRx for a price reduction on prep;  Patient's chart reviewed by Norleen Schillings CNRA prior to previsit and patient appropriate for the [facility];  Pre visit completed and red dot placed by patient's name on their procedure day (on provider's schedule);   Instructions sent to MyChart per patient request;

## 2024-06-23 NOTE — Progress Notes (Signed)
 [facility] History and Physical   Primary Care Physician:  Sherre Clapper, MD   Reason for Procedure:  Colorectal cancer screening  Plan:    Colonoscopy     HPI: Lindsay Gregory is a 63 y.o. female undergoing colonoscopy for colorectal cancer screening.  This is the patient's first colonoscopy.  Chart review indicates the patient had Cologuard testing in 2023.  No family history of of colorectal cancer or polyps.  Patient denies current symptoms of change in bowel habits or rectal bleeding.   Past Medical History:  Diagnosis Date   Anxiety    Hyperlipidemia    Plantar fasciitis    Prediabetes     Past Surgical History:  Procedure Laterality Date   ABLATION  02/03/2007   CERVICAL SPINE SURGERY  2013    Prior to Admission medications   Medication Sig Start Date End Date Taking? Authorizing Provider  clonazePAM  (KLONOPIN ) 0.5 MG tablet Take 1 tablet (0.5 mg total) by mouth daily. Patient taking differently: Take 0.25 mg by mouth daily. 12/11/23   CoxClapper, MD  escitalopram  (LEXAPRO ) 20 MG tablet Take 10 mg by mouth daily. AS NEEDED    [provider]  lisdexamfetamine (VYVANSE ) 20 MG capsule Take 1 capsule (20 mg total) by mouth daily. Patient taking differently: Take 20 mg by mouth daily as needed. 02/24/24   CoxClapper, MD  Multiple Vitamin (MULTIVITAMIN PO) Take by mouth daily. Patient taking differently: Take 1 tablet by mouth daily.    [provider]  VITAMIN D  PO Take by mouth daily. Patient taking differently: Take 1 tablet by mouth daily.    [provider]    Current Outpatient Medications  Medication Sig Dispense Refill   clonazePAM  (KLONOPIN ) 0.5 MG tablet Take 1 tablet (0.5 mg total) by mouth daily. (Patient taking differently: Take 0.25 mg by mouth daily.) 30 tablet 2   escitalopram  (LEXAPRO ) 20 MG tablet Take 10 mg by mouth daily. AS NEEDED     Multiple Vitamin (MULTIVITAMIN PO) Take by mouth daily. (Patient  taking differently: Take 1 tablet by mouth daily.)     VITAMIN D  PO Take by mouth daily. (Patient taking differently: Take 1 tablet by mouth daily.)     lisdexamfetamine (VYVANSE ) 20 MG capsule Take 1 capsule (20 mg total) by mouth daily. (Patient taking differently: Take 20 mg by mouth daily as needed.) 30 capsule 0   Current Facility-Administered Medications  Medication Dose Route Frequency Provider Last Rate Last Admin   0.9 %  sodium chloride  infusion  500 mL Intravenous Once Bryn Saline, Inocente HERO, MD        Allergies as of 06/24/2024 - Review Complete 06/24/2024  Allergen Reaction Noted   Amoxicillin Hives and Itching 05/23/2018    Family History  Problem Relation Age of Onset   Atrial fibrillation Father    Ovarian cancer Other        Maternal Great Aunt   Sleep apnea Neg Hx    Breast cancer Neg Hx    Colon polyps Neg Hx    Colon cancer Neg Hx    Esophageal cancer Neg Hx    Rectal cancer Neg Hx    Stomach cancer Neg Hx     Social History   Socioeconomic History   Marital status: Divorced    Spouse name: Not on file   Number of children: Not on file   Years of education: Not on file   Highest education level: Not on file  Occupational History  Not on file  Tobacco Use   Smoking status: Never   Smokeless tobacco: Never  Vaping Use   Vaping status: Never Used  Substance and Sexual Activity   Alcohol use: Not Currently    Comment: rarely   Drug use: Never   Sexual activity: Not Currently    Birth control/protection: Surgical    Comment: ablation  Other Topics Concern   Not on file  Social History Narrative   Not on file   Social Drivers of Health   Financial Resource Strain: Low Risk  (01/22/2023)   Overall Financial Resource Strain (CARDIA)    Difficulty of Paying Living Expenses: Not hard at all  Food Insecurity: No Food Insecurity (02/24/2024)   Hunger Vital Sign    Worried About Running Out of Food in the Last Year: Never true    Ran Out of Food in the  Last Year: Never true  Transportation Needs: No Transportation Needs (02/24/2024)   PRAPARE - Administrator, Civil Service (Medical): No    Lack of Transportation (Non-Medical): No  Physical Activity: Sufficiently Active (05/07/2023)   Exercise Vital Sign    Days of Exercise per Week: 7 days    Minutes of Exercise per Session: 60 min  Recent Concern: Physical Activity - Inactive (05/07/2023)   Exercise Vital Sign    Days of Exercise per Week: 0 days    Minutes of Exercise per Session: 60 min  Stress: No Stress Concern Present (01/22/2023)   [employer] of Occupational Health - Occupational Stress Questionnaire    Feeling of Stress : Not at all  Social Connections: Socially Isolated (01/22/2023)   Social Connection and Isolation Panel    Frequency of Communication with Friends and Family: Three times a week    Frequency of Social Gatherings with Friends and Family: Three times a week    Attends Religious Services: Never    Active Member of Clubs or Organizations: No    Attends Banker Meetings: Never    Marital Status: Divorced  Catering manager Violence: Not At Risk (02/24/2024)   Humiliation, Afraid, Rape, and Kick questionnaire    Fear of Current or Ex-Partner: No    Emotionally Abused: No    Physically Abused: No    Sexually Abused: No    Review of Systems:  All other review of systems negative except as mentioned in the HPI.  Physical Exam: Vital signs BP 131/86   Pulse 73   Temp 97.6 F (36.4 C) (Temporal)   Ht 5' 5 (1.651 m)   Wt 188 lb (85.3 kg)   SpO2 97%   BMI 31.28 kg/m   General:   Alert,  Well-developed, well-nourished, pleasant and cooperative in NAD Airway:  Mallampati 2 Lungs:  Clear throughout to auscultation.   Heart:  Regular rate and rhythm; no murmurs, clicks, rubs,  or gallops. Abdomen:  Soft, nontender and nondistended. Normal bowel sounds.   Neuro/Psych:  Normal mood and affect. A and O x 3  Inocente Hausen,  MD [facility]

## 2024-06-24 ENCOUNTER — Ambulatory Visit: Admitting: Pediatrics

## 2024-06-24 ENCOUNTER — Encounter: Payer: Self-pay | Admitting: Pediatrics

## 2024-06-24 VITALS — BP 104/62 | HR 56 | Temp 97.6°F | Resp 10 | Ht 65.0 in | Wt 188.0 lb

## 2024-06-24 DIAGNOSIS — D123 Benign neoplasm of transverse colon: Secondary | ICD-10-CM

## 2024-06-24 DIAGNOSIS — K635 Polyp of colon: Secondary | ICD-10-CM

## 2024-06-24 DIAGNOSIS — Z1211 Encounter for screening for malignant neoplasm of colon: Secondary | ICD-10-CM

## 2024-06-24 DIAGNOSIS — D128 Benign neoplasm of rectum: Secondary | ICD-10-CM | POA: Diagnosis not present

## 2024-06-24 DIAGNOSIS — K648 Other hemorrhoids: Secondary | ICD-10-CM

## 2024-06-24 MED ORDER — SODIUM CHLORIDE 0.9 % IV SOLN: 500.0000 mL | Freq: Once | INTRAVENOUS | Status: DC

## 2024-06-24 NOTE — Patient Instructions (Addendum)
 Await pathology results.                           - Repeat colonoscopy for surveillance based on                            pathology results.                           - The findings and recommendations were discussed                            with the patient's family.                           - Return to referring physician.                           - Patient has a contact number available for                            emergencies. The signs and symptoms of potential                            delayed complications were discussed with the                            patient. Return to normal activities tomorrow.                            Written discharge instructions were provided to the                            patient. Handout on polyps     YOU HAD AN ENDOSCOPIC PROCEDURE TODAY AT THE [facility]:   Refer to the procedure report that was given to you for any specific questions about what was found during the examination.  If the procedure report does not answer your questions, please call your gastroenterologist to clarify.  If you requested that your care partner not be given the details of your procedure findings, then the procedure report has been included in a sealed envelope for you to review at your convenience later.  YOU SHOULD EXPECT: Some feelings of bloating in the abdomen. Passage of more gas than usual.  Walking can help get rid of the air that was put into your GI tract during the procedure and reduce the bloating. If you had a lower endoscopy (such as a colonoscopy or flexible sigmoidoscopy) you may notice spotting of blood in your stool or on the toilet paper. If you underwent a bowel prep for your procedure, you may not have a normal bowel movement for a few days.  Please Note:  You might notice some irritation and congestion in your nose or some drainage.  This is from the oxygen used during your procedure.  There is no need for concern and it should  clear up in a day or so.  SYMPTOMS TO REPORT IMMEDIATELY:  Following lower endoscopy (colonoscopy or flexible sigmoidoscopy):  Excessive amounts of blood  in the stool  Significant tenderness or worsening of abdominal pains  Swelling of the abdomen that is new, acute  Fever of 100F or higher   For urgent or emergent issues, a gastroenterologist can be reached at any hour by calling [phone]. Do not use MyChart messaging for urgent concerns.    DIET:  We do recommend a small meal at first, but then you may proceed to your regular diet.  Drink plenty of fluids but you should avoid alcoholic beverages for 24 hours.  ACTIVITY:  You should plan to take it easy for the rest of today and you should NOT DRIVE or use heavy machinery until tomorrow (because of the sedation medicines used during the test).    FOLLOW UP: Our staff will call the number listed on your records the next business day following your procedure.  We will call around 7:15- 8:00 am to check on you and address any questions or concerns that you may have regarding the information given to you following your procedure. If we do not reach you, we will leave a message.     If any biopsies were taken you will be contacted by phone or by letter within the next 1-3 weeks.  Please call us  at [phone] if you have not heard about the biopsies in 3 weeks.    SIGNATURES/CONFIDENTIALITY: You and/or your care partner have signed paperwork which will be entered into your electronic medical record.  These signatures attest to the fact that that the information above on your After Visit Summary has been reviewed and is understood.  Full responsibility of the confidentiality of this discharge information lies with you and/or your care-partner.

## 2024-06-24 NOTE — Progress Notes (Signed)
 Called to room to assist during endoscopic procedure.  Patient ID and intended procedure confirmed with present staff. Received instructions for my participation in the procedure from the performing physician.

## 2024-06-24 NOTE — Op Note (Signed)
 [facility] Patient Name: Lindsay Gregory Procedure Date: 06/24/2024 1:17 PM MRN: [id number] Endoscopist: Inocente Hausen , MD, [id number] Age: 63 Referring MD:  Date of Birth: [birth year] Gender: Female Account #: [id number] Procedure:                Colonoscopy Indications:              Screening for colorectal malignant neoplasm, This                            is the patient's first colonoscopy Medicines:                Monitored Anesthesia Care Procedure:                Pre-Anesthesia Assessment:                           - Prior to the procedure, a History and Physical                            was performed, and patient medications and                            allergies were reviewed. The patient's tolerance of                            previous anesthesia was also reviewed. The risks                            and benefits of the procedure and the sedation                            options and risks were discussed with the patient.                            All questions were answered, and informed consent                            was obtained. Prior Anticoagulants: The patient has                            taken no anticoagulant or antiplatelet agents. ASA                            Grade Assessment: II - A patient with mild systemic                            disease. After reviewing the risks and benefits,                            the patient was deemed in satisfactory condition to                            undergo the procedure.  After obtaining informed consent, the colonoscope                            was passed under direct vision. Throughout the                            procedure, the patient's blood pressure, pulse, and                            oxygen saturations were monitored continuously. The                            CF [id number] #[id number] was introduced through the anus                            and advanced to  the cecum, identified by                            appendiceal orifice and ileocecal valve. The                            colonoscopy was performed without difficulty. The                            patient tolerated the procedure well. The quality                            of the bowel preparation was good. The ileocecal                            valve, appendiceal orifice, and rectum were                            photographed. Scope In: 1:21:05 PM Scope Out: 1:42:42 PM Scope Withdrawal Time: 0 hours 17 minutes 56 seconds  Total Procedure Duration: 0 hours 21 minutes 37 seconds  Findings:                 The perianal and digital rectal examinations were                            normal. Pertinent negatives include normal                            sphincter tone and no palpable rectal lesions.                           Three sessile polyps were found in the transverse                            colon. The polyps were 4 to 5 mm in size. These                            polyps were removed with a cold biopsy forceps.  Resection and retrieval were complete.                           A 5 mm polyp was found in the rectum. The polyp was                            sessile. The polyp was removed with a cold snare.                            Resection and retrieval were complete.                           Internal hemorrhoids were found during retroflexion. Complications:            No immediate complications. Estimated blood loss:                            Minimal. Estimated Blood Loss:     Estimated blood loss was minimal. Impression:               - Three 4 to 5 mm polyps in the transverse colon,                            removed with a cold biopsy forceps. Resected and                            retrieved.                           - One 5 mm polyp in the rectum, removed with a cold                            snare. Resected and retrieved.                            - Internal hemorrhoids. Recommendation:           - Discharge patient to home (ambulatory).                           - Await pathology results.                           - Repeat colonoscopy for surveillance based on                            pathology results.                           - The findings and recommendations were discussed                            with the patient's family.                           - Return to referring physician.                           -  Patient has a contact number available for                            emergencies. The signs and symptoms of potential                            delayed complications were discussed with the                            patient. Return to normal activities tomorrow.                            Written discharge instructions were provided to the                            patient. Inocente Hausen, MD 06/24/2024 1:47:00 PM This report has been signed electronically.

## 2024-06-24 NOTE — Progress Notes (Signed)
 Report to PACU, RN, vss, BBS= Clear.

## 2024-06-24 NOTE — Progress Notes (Signed)
 Pt's states no medical or surgical changes since previsit or office visit.

## 2024-06-25 ENCOUNTER — Telehealth: Payer: Self-pay | Admitting: *Deleted

## 2024-06-25 NOTE — Telephone Encounter (Signed)
 No answer after post call. Left a message.

## 2024-06-29 ENCOUNTER — Ambulatory Visit: Payer: Self-pay | Admitting: Pediatrics

## 2024-06-29 LAB — SURGICAL PATHOLOGY

## 2024-07-10 ENCOUNTER — Inpatient Hospital Stay: Admission: RE | Admit: 2024-07-10 | Payer: BC Managed Care – PPO | Source: Ambulatory Visit

## 2024-07-21 ENCOUNTER — Other Ambulatory Visit: Payer: Self-pay | Admitting: Family Medicine

## 2024-07-21 DIAGNOSIS — F411 Generalized anxiety disorder: Secondary | ICD-10-CM

## 2024-07-21 NOTE — Telephone Encounter (Unsigned)
 Copied from CRM [id number]. Topic: Clinical - Medication Refill >> Jul 21, 2024 12:33 PM Wess S wrote: Medication: clonazePAM  (KLONOPIN ) 0.5 MG tablet  escitalopram  (LEXAPRO ) 20 MG tablet   Has the patient contacted their pharmacy? No (Agent: If no, request that the patient contact the pharmacy for the refill. If patient does not wish to contact the pharmacy document the reason why and proceed with request.) (Agent: If yes, when and what did the pharmacy advise?)  This is the patient's preferred pharmacy:  [id number] - [place], [place] - [address] AT [facility] [address] [place] [address] Phone: [phone] Fax: [phone]  Is this the correct pharmacy for this prescription? Yes If no, delete pharmacy and type the correct one.   Has the prescription been filled recently? No  Is the patient out of the medication? Yes  Has the patient been seen for an appointment in the last year OR does the patient have an upcoming appointment? Yes  Can we respond through MyChart? Yes  Agent: Please be advised that Rx refills may take up to 3 business days. We ask that you follow-up with your pharmacy.

## 2024-07-22 ENCOUNTER — Encounter: Payer: Self-pay | Admitting: Family Medicine

## 2024-07-24 MED ORDER — CLONAZEPAM 0.5 MG PO TABS: 0.2500 mg | ORAL_TABLET | Freq: Every day | ORAL | 2 refills | Status: DC

## 2024-07-24 MED ORDER — ESCITALOPRAM OXALATE 20 MG PO TABS: 10.0000 mg | ORAL_TABLET | Freq: Every day | ORAL | 1 refills | Status: DC

## 2024-07-27 ENCOUNTER — Ambulatory Visit: Admitting: Family Medicine

## 2024-07-27 ENCOUNTER — Encounter: Payer: Self-pay | Admitting: Family Medicine

## 2024-07-27 VITALS — BP 109/68 | HR 66 | Temp 98.0°F | Resp 18 | Ht 65.0 in | Wt 194.0 lb

## 2024-07-27 DIAGNOSIS — R7303 Prediabetes: Secondary | ICD-10-CM | POA: Diagnosis not present

## 2024-07-27 DIAGNOSIS — E782 Mixed hyperlipidemia: Secondary | ICD-10-CM

## 2024-07-27 DIAGNOSIS — F330 Major depressive disorder, recurrent, mild: Secondary | ICD-10-CM

## 2024-07-27 DIAGNOSIS — F411 Generalized anxiety disorder: Secondary | ICD-10-CM | POA: Diagnosis not present

## 2024-07-27 MED ORDER — ESCITALOPRAM OXALATE 20 MG PO TABS: 10.0000 mg | ORAL_TABLET | Freq: Every day | ORAL | 1 refills | Status: DC

## 2024-07-27 NOTE — Assessment & Plan Note (Signed)
 Depression and Anxiety Disorder Managing with Lexapro  and clonazepam . Tapering Lexapro  from half to quarter tablet daily. Clonazepam  effective for anxiety. - Prescribe Lexapro  20 mg tablets, instruct to cut in half, 90-day supply with refills.

## 2024-07-27 NOTE — Progress Notes (Signed)
 Subjective:  Patient ID: Lindsay Gregory, female    DOB: 07-22-1961  Age: 63 y.o. MRN: [id number]  Chief Complaint  Patient presents with   Medical Management of Chronic Issues    Discussed the use of AI scribe software for clinical note transcription with the patient, who gave verbal consent to proceed.  History of Present Illness   Lindsay Gregory is a 63 year old female who presents for medication management.  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 - Current pharmacologic management includes Lexapro  20 mg, which she cuts in half and sometimes quarters - History of dizziness when discontinuing Lexapro  - Side effects from Lexapro  include weight gain and low libido - Previously trialed Vyvanse  for binge eating, which resulted in agitation and insomnia - Uses clonazepam , taking a quarter of the prescribed dose, for anxiety management, especially related to medical procedures such as colonoscopy         05/19/2024   12:01 PM 02/24/2024    2:04 PM 08/16/2023    7:32 AM 05/07/2023    9:40 AM 01/22/2023    3:37 PM  Depression screen PHQ 2/9  Decreased Interest 0 0 0 1 0  Down, Depressed, Hopeless 1 1 0 1 1  PHQ - 2 Score 1 1 0 2 1  Altered sleeping  0 0 0 0  Tired, decreased energy  1 0 1 0  Change in appetite  3 3 1  0  Feeling bad or failure about yourself   1 1 0 0  Trouble concentrating  0 0 0 0  Moving slowly or  fidgety/restless  0 0 0 0  Suicidal thoughts  0 0 0 0  PHQ-9 Score  6 4 4 1   Difficult doing work/chores  Somewhat difficult Not difficult at all Somewhat difficult Not difficult at all        05/19/2024   12:00 PM  Fall Risk   Falls in the past year? 0  Number falls in past yr: 0  Injury with Fall? 0  Follow up Falls evaluation completed    Patient Care Team: Sherre Clapper, MD as PCP - General (Family Medicine) Cox, Clapper, MD as Referring Physician (Family Medicine)   Review of Systems  Constitutional:  Negative for chills, diaphoresis, fatigue and fever.  HENT:  Negative for congestion, ear pain and sinus pain.   Eyes: Negative.   Respiratory:  Negative for cough and shortness of breath.   Cardiovascular:  Negative for chest pain.  Gastrointestinal:  Negative for abdominal pain, constipation, diarrhea, nausea and vomiting.  Endocrine: Negative.   Genitourinary:  Negative for dysuria, frequency and urgency.  Musculoskeletal:  Negative for arthralgias.  Skin: Negative.   Allergic/Immunologic: Negative.   Neurological:  Negative for dizziness, weakness, light-headedness and headaches.  Hematological: Negative.   Psychiatric/Behavioral:  Negative for dysphoric mood. The patient is not nervous/anxious.     Current Outpatient Medications on File Prior to Visit  Medication Sig Dispense Refill   clonazePAM  (KLONOPIN ) 0.5 MG tablet Take 0.5  tablets (0.25 mg total) by mouth daily. 15 tablet 2   lisdexamfetamine (VYVANSE ) 20 MG capsule Take 1 capsule (20 mg total) by mouth daily. (Patient taking differently: Take 20 mg by mouth daily as needed.) 30 capsule 0   Multiple Vitamin (MULTIVITAMIN PO) Take by mouth daily. (Patient taking differently: Take 1 tablet by mouth daily.)     VITAMIN D  PO Take by mouth daily. (Patient taking differently: Take 1 tablet by mouth daily.)     No current facility-administered medications on file prior to visit.   Past Medical History:  Diagnosis Date    Anxiety    Hyperlipidemia    Plantar fasciitis    Prediabetes    Past Surgical History:  Procedure Laterality Date   ABLATION  02/03/2007   CERVICAL SPINE SURGERY  2013    Family History  Problem Relation Age of Onset   Atrial fibrillation Father    Ovarian cancer Other        Maternal Great Aunt   Sleep apnea Neg Hx    Breast cancer Neg Hx    Colon polyps Neg Hx    Colon cancer Neg Hx    Esophageal cancer Neg Hx    Rectal cancer Neg Hx    Stomach cancer Neg Hx    Social History   Socioeconomic History   Marital status: Divorced    Spouse name: Not on file   Number of children: Not on file   Years of education: Not on file   Highest education level: Not on file  Occupational History   Not on file  Tobacco Use   Smoking status: Never   Smokeless tobacco: Never  Vaping Use   Vaping status: Never Used  Substance and Sexual Activity   Alcohol use: Not Currently    Comment: rarely   Drug use: Never   Sexual activity: Not Currently    Birth control/protection: Surgical    Comment: ablation  Other Topics Concern   Not on file  Social History Narrative   Not on file   Social Drivers of Health   Financial Resource Strain: Low Risk  (01/22/2023)   Overall Financial Resource Strain (CARDIA)    Difficulty of Paying Living Expenses: Not hard at all  Food Insecurity: No Food Insecurity (02/24/2024)   Hunger Vital Sign    Worried About Running Out of Food in the Last Year: Never true    Ran Out of Food in the Last Year: Never true  Transportation Needs: No Transportation Needs (02/24/2024)   PRAPARE - Administrator, Civil Service (Medical): No    Lack of Transportation (Non-Medical): No  Physical Activity: Sufficiently Active (05/07/2023)   Exercise Vital Sign    Days of Exercise per Week: 7 days    Minutes of Exercise per Session: 60 min  Recent Concern: Physical Activity - Inactive (05/07/2023)   Exercise Vital Sign    Days of Exercise per Week: 0 days     Minutes of Exercise per Session: 60 min  Stress: No Stress Concern Present (01/22/2023)   [employer] of Occupational Health - Occupational Stress Questionnaire    Feeling of Stress : Not at all  Social Connections: Socially Isolated (01/22/2023)   Social Connection and Isolation Panel    Frequency of Communication with Friends and Family: Three times a week    Frequency of Social Gatherings with Friends and Family: Three times a week    Attends Religious Services: Never    Active Member  of Clubs or Organizations: No    Attends Banker Meetings: Never    Marital Status: Divorced    Objective:  BP 109/68   Pulse 66   Temp 98 F (36.7 C) (Temporal)   Resp 18   Ht 5' 5 (1.651 m)   Wt 194 lb (88 kg)   SpO2 97%   BMI 32.28 kg/m      07/27/2024    3:45 PM 06/24/2024    2:05 PM 06/24/2024    1:55 PM  BP/Weight  Systolic BP 109 104 102  Diastolic BP 68 62 56  Wt. (Lbs) 194    BMI 32.28 kg/m2      Physical Exam Vitals reviewed.  Constitutional:      General: She is not in acute distress.    Appearance: Normal appearance. She is obese. She is not ill-appearing.  Eyes:     Conjunctiva/sclera: Conjunctivae normal.  Cardiovascular:     Rate and Rhythm: Normal rate and regular rhythm.     Heart sounds: Normal heart sounds. No murmur heard. Pulmonary:     Effort: Pulmonary effort is normal.     Breath sounds: Normal breath sounds. No wheezing.  Abdominal:     Palpations: Abdomen is soft.  Musculoskeletal:        General: Normal range of motion.  Skin:    General: Skin is warm.  Neurological:     Mental Status: She is alert. Mental status is at baseline.  Psychiatric:        Mood and Affect: Mood normal.        Behavior: Behavior normal.     Lab Results  Component Value Date   WBC 7.7 02/24/2024   HGB 12.7 02/24/2024   HCT 38.6 02/24/2024   PLT 266 02/24/2024   GLUCOSE 88 02/24/2024   CHOL 204 (H) 02/24/2024   TRIG 59 02/24/2024   HDL 89  02/24/2024   LDLCALC 104 (H) 02/24/2024   ALT 13 02/24/2024   AST 17 02/24/2024   NA 138 02/24/2024   K 5.1 02/24/2024   CL 101 02/24/2024   CREATININE 0.88 02/24/2024   BUN 13 02/24/2024   CO2 24 02/24/2024   TSH 1.760 02/24/2024   HGBA1C 5.9 (H) 02/24/2024      Assessment & Plan:  Mixed hyperlipidemia Assessment & Plan: Well controlled.  No changes to medicines.  Continue to work on eating a healthy diet and exercise.  Managing through diet and exercise. ASCVD risk score 2.3, low risk. - Continue lifestyle modifications including diet and exercise. - Monitor cholesterol levels next appointment with fasting lipid panel.  Orders: -     CBC with Differential/Platelet -     Comprehensive metabolic panel with GFR  Prediabetes Assessment & Plan: Prediabetes Lab Results  Component Value Date   HGBA1C 5.9 (H) 02/24/2024   Aware of need for lifestyle modifications. - Check A1c during next appointment.    Depression, major, recurrent, mild (HCC) Assessment & Plan: Depression and Anxiety Disorder Managing with Lexapro  and clonazepam . Tapering Lexapro  from half to quarter tablet daily. Clonazepam  effective for anxiety. - Prescribe Lexapro  20 mg tablets, instruct to cut in half, 90-day supply with refills.  Orders: -     Escitalopram  Oxalate; Take 0.5 tablets (10 mg total) by mouth daily. AS NEEDED  Dispense: 90 tablet; Refill: 1  GAD (generalized anxiety disorder) Assessment & Plan: The current medical regimen is effective;  continue present plan and medications.  Patient is taking Klonopin  0.5  mg once daily as needed, Lexapro  10 mg daily.    Orders: -     Escitalopram  Oxalate; Take 0.5 tablets (10 mg total) by mouth daily. AS NEEDED  Dispense: 90 tablet; Refill: 1   Body mass index is 32.28 kg/m.    Follow-up: Return for keep appointment with Dr. Sherre in October.   I,Angela Taylor,acting as a Neurosurgeon for Harrie CHRISTELLA Cedar, FNP.,have documented all relevant  documentation on the behalf of Harrie CHRISTELLA Cedar, FNP,as directed by  Harrie CHRISTELLA Cedar, FNP while in the presence of Harrie CHRISTELLA Cedar, FNP.   An After Visit Summary was printed and given to the patient.  I attest that I have reviewed this visit and agree with the plan scribed by my staff.   Harrie CHRISTELLA Cedar, FNP [facility] [phone]

## 2024-07-27 NOTE — Assessment & Plan Note (Signed)
 Well controlled.  No changes to medicines.  Continue to work on eating a healthy diet and exercise.  Managing through diet and exercise. ASCVD risk score 2.3, low risk. - Continue lifestyle modifications including diet and exercise. - Monitor cholesterol levels next appointment with fasting lipid panel.

## 2024-07-27 NOTE — Assessment & Plan Note (Signed)
 Prediabetes Lab Results  Component Value Date   HGBA1C 5.9 (H) 02/24/2024   Aware of need for lifestyle modifications. - Check A1c during next appointment.

## 2024-07-27 NOTE — Assessment & Plan Note (Signed)
 The current medical regimen is effective;  continue present plan and medications.  Patient is taking Klonopin  0.5 mg once daily as needed, Lexapro  10 mg daily.

## 2024-07-28 ENCOUNTER — Ambulatory Visit: Payer: Self-pay | Admitting: Family Medicine

## 2024-07-28 LAB — COMPREHENSIVE METABOLIC PANEL WITH GFR
ALT: 11 IU/L (ref 0–32)
AST: 18 IU/L (ref 0–40)
Albumin: 4.5 g/dL (ref 3.9–4.9)
Alkaline Phosphatase: 72 IU/L (ref 49–135)
BUN/Creatinine Ratio: 15 (ref 12–28)
BUN: 15 mg/dL (ref 8–27)
Bilirubin Total: <0.2 mg/dL (ref 0.0–1.2)
CO2: 23 mmol/L (ref 20–29)
Calcium: 9.4 mg/dL (ref 8.7–10.3)
Chloride: 102 mmol/L (ref 96–106)
Creatinine, Ser: 0.98 mg/dL (ref 0.57–1.00)
Globulin, Total: 2.2 g/dL (ref 1.5–4.5)
Glucose: 95 mg/dL (ref 70–99)
Potassium: 4 mmol/L (ref 3.5–5.2)
Sodium: 139 mmol/L (ref 134–144)
Total Protein: 6.7 g/dL (ref 6.0–8.5)
eGFR: 65 mL/min/1.73 (ref >59)

## 2024-07-28 LAB — CBC WITH DIFFERENTIAL/PLATELET
Basophils Absolute: 0.1 x10E3/uL (ref 0.0–0.2)
Basos: 1 %
EOS (ABSOLUTE): 0.7 x10E3/uL — ABNORMAL HIGH (ref 0.0–0.4)
Eos: 8 %
Hematocrit: 39.1 % (ref 34.0–46.6)
Hemoglobin: 12.7 g/dL (ref 11.1–15.9)
Immature Grans (Abs): 0 x10E3/uL (ref 0.0–0.1)
Immature Granulocytes: 0 %
Lymphocytes Absolute: 3.1 x10E3/uL (ref 0.7–3.1)
Lymphs: 35 %
MCH: 29.4 pg (ref 26.6–33.0)
MCHC: 32.5 g/dL (ref 31.5–35.7)
MCV: 91 fL (ref 79–97)
Monocytes Absolute: 0.6 x10E3/uL (ref 0.1–0.9)
Monocytes: 7 %
Neutrophils Absolute: 4.5 x10E3/uL (ref 1.4–7.0)
Neutrophils: 49 %
Platelets: 265 x10E3/uL (ref 150–450)
RBC: 4.32 x10E6/uL (ref 3.77–5.28)
RDW: 13.1 % (ref 11.7–15.4)
WBC: 9 x10E3/uL (ref 3.4–10.8)

## 2024-07-29 ENCOUNTER — Ambulatory Visit
Admission: RE | Admit: 2024-07-29 | Discharge: 2024-07-29 | Disposition: A | Discharge status: Home / Self Care | Source: Ambulatory Visit | Attending: Family Medicine | Admitting: Family Medicine

## 2024-07-29 DIAGNOSIS — Z1231 Encounter for screening mammogram for malignant neoplasm of breast: Secondary | ICD-10-CM

## 2024-08-03 ENCOUNTER — Ambulatory Visit: Payer: Self-pay | Admitting: Family Medicine

## 2024-08-26 ENCOUNTER — Ambulatory Visit: Admitting: Family Medicine

## 2024-08-26 ENCOUNTER — Encounter: Payer: Self-pay | Admitting: Family Medicine

## 2024-08-26 VITALS — BP 112/70 | HR 65 | Temp 98.4°F | Ht 65.0 in | Wt 198.0 lb

## 2024-08-26 DIAGNOSIS — F50811 Binge eating disorder, moderate: Secondary | ICD-10-CM

## 2024-08-26 DIAGNOSIS — M7120 Synovial cyst of popliteal space [Baker], unspecified knee: Secondary | ICD-10-CM | POA: Insufficient documentation

## 2024-08-26 DIAGNOSIS — F330 Major depressive disorder, recurrent, mild: Secondary | ICD-10-CM

## 2024-08-26 DIAGNOSIS — E782 Mixed hyperlipidemia: Secondary | ICD-10-CM | POA: Diagnosis not present

## 2024-08-26 DIAGNOSIS — Z23 Encounter for immunization: Secondary | ICD-10-CM

## 2024-08-26 DIAGNOSIS — M7121 Synovial cyst of popliteal space [Baker], right knee: Secondary | ICD-10-CM

## 2024-08-26 DIAGNOSIS — R7303 Prediabetes: Secondary | ICD-10-CM

## 2024-08-26 LAB — POCT LIPID PANEL
HDL: 58
LDL: 71
Non-HDL: 86
TC: 144
TRG: 73

## 2024-08-26 LAB — POCT GLYCOSYLATED HEMOGLOBIN (HGB A1C): HbA1c POC (<> result, manual entry): 5.9 % (ref 4.0–5.6)

## 2024-08-26 MED ORDER — NAPROXEN 500 MG PO TABS: 500.0000 mg | ORAL_TABLET | Freq: Two times a day (BID) | ORAL | 1 refills | Status: DC

## 2024-08-26 MED ORDER — PHENTERMINE HCL 37.5 MG PO CAPS: 37.5000 mg | ORAL_CAPSULE | ORAL | 0 refills | Status: AC

## 2024-08-26 NOTE — Assessment & Plan Note (Addendum)
 Baker's cysts, right leg painful due to inflammation. - Prescribed naproxen twice daily for 1-2 weeks. - Consider orthopedic referral if unresolved with treatment.

## 2024-08-26 NOTE — Assessment & Plan Note (Addendum)
 The current medical regimen is effective;  continue present plan and medications. Continue lexapro  20 mg 1/2 daily and clonazepam  0.5 mg 1/2 daily

## 2024-08-26 NOTE — Progress Notes (Signed)
 Subjective:  Patient ID: Lindsay Gregory, female    DOB: [birth year]  Age: 63 y.o. MRN: [id number]  Chief Complaint  Patient presents with   Medical Management of Chronic Issues    HPI: Discussed the use of AI scribe software for clinical note transcription with the patient, who gave verbal consent to proceed.  History of Present Illness Lindsay Gregory is a 63 year old female who presents with a hard knot on the back of her right leg and concerns about weight management.  Right posterior leg mass - Hard knot on the back of the right leg - Described as 'weird' and somewhat sore, but not extremely painful - No medication taken for pain as discomfort is mild - Suspects compression socks as a possible cause  Weight management and disordered eating - Difficulty with weight management - History of binge eating, particularly in the evenings - Emotional eating and stress identified as triggers, especially related to work - Diet is well managed during the day, but struggles with consuming sweets at night - Vyvanse  previously used for weight management, improved motivation but caused sleep disturbances and agitation - No other weight loss medications tried due to insurance limitations  Anxiety and mood - Takes Lexapro  at a half dose of 10 mg for anxiety - Lexapro  helps maintain daytime stability  Pain management - Uses Tylenol for pain relief as needed - Has not used Tylenol for the right leg knot  Constitutional and systemic symptoms - No fevers, chills, sweats, earaches, sore throat, or stuffy nose - No chest pain, bowel problems, or bladder issues  Recent preventive care - Recent colonoscopy without complications or issues  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[place] her [place]       08/26/2024    3:37 PM 05/19/2024   12:01 PM 02/24/2024    2:04 PM 08/16/2023    7:32 AM 05/07/2023    9:40 AM  Depression screen PHQ 2/9  Decreased Interest 0 0 0 0 1  Down,  Depressed, Hopeless 0 1 1 0 1  PHQ - 2 Score 0 1 1 0 2  Altered sleeping 0  0 0 0  Tired, decreased energy 1  1 0 1  Change in appetite 3  3 3 1   Feeling bad or failure about yourself  0  1 1 0  Trouble concentrating 0  0 0 0  Moving slowly or fidgety/restless 0  0 0 0  Suicidal thoughts 0  0 0 0  PHQ-9 Score 4  6 4 4   Difficult doing work/chores Not difficult at all  Somewhat difficult Not difficult at all Somewhat difficult        05/19/2024   12:00 PM  Fall Risk   Falls in the past year? 0  Number falls in past yr: 0  Injury with Fall? 0  Follow up Falls evaluation completed    Patient Care Team: Sherre Clapper, MD as PCP - General (Family Medicine) Sherre Clapper, MD as Referring Physician (Family Medicine)   Review of Systems  All other systems reviewed and are negative.   Current Outpatient Medications on File Prior to Visit  Medication Sig Dispense Refill   clonazePAM  (KLONOPIN ) 0.5 MG tablet Take 0.5 tablets (0.25 mg total) by mouth daily. 15 tablet 2   escitalopram  (LEXAPRO ) 20 MG tablet Take 0.5 tablets (10 mg total) by mouth daily. AS NEEDED 90 tablet 1   lisdexamfetamine (VYVANSE ) 20 MG capsule Take 1 capsule (  20 mg total) by mouth daily. (Patient taking differently: Take 20 mg by mouth daily as needed.) 30 capsule 0   Multiple Vitamin (MULTIVITAMIN PO) Take by mouth daily. (Patient taking differently: Take 1 tablet by mouth daily.)     VITAMIN D  PO Take by mouth daily. (Patient taking differently: Take 1 tablet by mouth daily.)     No current facility-administered medications on file prior to visit.   Past Medical History:  Diagnosis Date   Anxiety    Hyperlipidemia    Plantar fasciitis    Prediabetes    Past Surgical History:  Procedure Laterality Date   ABLATION  02/03/2007   CERVICAL SPINE SURGERY  2013    Family History  Problem Relation Age of Onset   Atrial fibrillation Father    Ovarian cancer Other        Maternal Great Aunt   Sleep apnea Neg Hx     Breast cancer Neg Hx    Colon polyps Neg Hx    Colon cancer Neg Hx    Esophageal cancer Neg Hx    Rectal cancer Neg Hx    Stomach cancer Neg Hx    Social History   Socioeconomic History   Marital status: Divorced    Spouse name: Not on file   Number of children: Not on file   Years of education: Not on file   Highest education level: Not on file  Occupational History   Not on file  Tobacco Use   Smoking status: Never   Smokeless tobacco: Never  Vaping Use   Vaping status: Never Used  Substance and Sexual Activity   Alcohol use: Not Currently    Comment: rarely   Drug use: Never   Sexual activity: Not Currently    Birth control/protection: Surgical    Comment: ablation  Other Topics Concern   Not on file  Social History Narrative   Not on file   Social Drivers of Health   Financial Resource Strain: Low Risk  (01/22/2023)   Overall Financial Resource Strain (CARDIA)    Difficulty of Paying Living Expenses: Not hard at all  Food Insecurity: No Food Insecurity (02/24/2024)   Hunger Vital Sign    Worried About Running Out of Food in the Last Year: Never true    Ran Out of Food in the Last Year: Never true  Transportation Needs: No Transportation Needs (02/24/2024)   PRAPARE - Administrator, Civil Service (Medical): No    Lack of Transportation (Non-Medical): No  Physical Activity: Sufficiently Active (05/07/2023)   Exercise Vital Sign    Days of Exercise per Week: 7 days    Minutes of Exercise per Session: 60 min  Recent Concern: Physical Activity - Inactive (05/07/2023)   Exercise Vital Sign    Days of Exercise per Week: 0 days    Minutes of Exercise per Session: 60 min  Stress: No Stress Concern Present (01/22/2023)   [employer] of Occupational Health - Occupational Stress Questionnaire    Feeling of Stress : Not at all  Social Connections: Socially Isolated (01/22/2023)   Social Connection and Isolation Panel    Frequency of Communication with  Friends and Family: Three times a week    Frequency of Social Gatherings with Friends and Family: Three times a week    Attends Religious Services: Never    Active Member of Clubs or Organizations: No    Attends Banker Meetings: Never    Marital Status: Divorced  Objective:  BP 112/70   Pulse 65   Temp 98.4 F (36.9 C)   Ht 5' 5 (1.651 m)   Wt 198 lb (89.8 kg)   SpO2 98%   BMI 32.95 kg/m      08/26/2024    3:33 PM 07/27/2024    3:45 PM 06/24/2024    2:05 PM  BP/Weight  Systolic BP 112 109 104  Diastolic BP 70 68 62  Wt. (Lbs) 198 194   BMI 32.95 kg/m2 32.28 kg/m2     Physical Exam Vitals reviewed.  Constitutional:      Appearance: Normal appearance. She is obese.  Neck:     Vascular: No carotid bruit.  Cardiovascular:     Rate and Rhythm: Normal rate and regular rhythm.     Heart sounds: Normal heart sounds.  Pulmonary:     Effort: Pulmonary effort is normal. No respiratory distress.     Breath sounds: Normal breath sounds.  Abdominal:     General: Abdomen is flat. Bowel sounds are normal.     Palpations: Abdomen is soft.     Tenderness: There is no abdominal tenderness.  Neurological:     Mental Status: She is alert and oriented to person, place, and time.  Psychiatric:        Mood and Affect: Mood normal.        Behavior: Behavior normal.         Lab Results  Component Value Date   WBC 9.0 07/27/2024   HGB 12.7 07/27/2024   HCT 39.1 07/27/2024   PLT 265 07/27/2024   GLUCOSE 95 07/27/2024   CHOL 204 (H) 02/24/2024   TRIG 59 02/24/2024   HDL 89 02/24/2024   LDLCALC 104 (H) 02/24/2024   ALT 11 07/27/2024   AST 18 07/27/2024   NA 139 07/27/2024   K 4.0 07/27/2024   CL 102 07/27/2024   CREATININE 0.98 07/27/2024   BUN 15 07/27/2024   CO2 23 07/27/2024   TSH 1.760 02/24/2024   HGBA1C 5.9 08/26/2024    Results for orders placed or performed in visit on 08/26/24  POCT glycosylated hemoglobin (Hb A1C)   Collection Time:  08/26/24  3:50 PM  Result Value Ref Range   Hemoglobin A1C     HbA1c POC (<> result, manual entry) 5.9 4.0 - 5.6 %   HbA1c, POC (prediabetic range)     HbA1c, POC (controlled diabetic range)    POCT Lipid Panel   Collection Time: 08/26/24  3:52 PM  Result Value Ref Range   TC 144    HDL 58    TRG 73    LDL 71    Non-HDL 86    TC/HDL    .  Assessment & Plan:   Assessment & Plan Mixed hyperlipidemia - Cholesterol at goal without medicines.  - Recommend continue to work on eating healthy diet and exercise.  Orders:   POCT Lipid Panel  Prediabetes Recommend continue to work on eating healthy diet and exercise. HBA1C 5.9.   Orders:   POCT glycosylated hemoglobin (Hb A1C)  Depression, major, recurrent, mild The current medical regimen is effective;  continue present plan and medications. Continue lexapro  20 mg 1/2 daily and clonazepam  0.5 mg 1/2 daily    Moderate binge-eating disorder Binge eating disorder with emotional eating and weight gain. Vyvanse  effective but caused insomnia and agitation. Phentermine considered as alternative. - Prescribed phentermine as appetite suppressant. - Advised morning dosing to avoid sleep issues. - Instructed to verify  insurance coverage for weight loss medications. - Encouraged dietary modifications with fruits, vegetables, and protein. - Discussed intermittent fasting for calorie reduction. - Continue counseling with Powell Oto, dietician    Bakers cyst, right Baker's cysts, right leg painful due to inflammation. - Prescribed naproxen twice daily for 1-2 weeks. - Consider orthopedic referral if unresolved with treatment.    Encounter for immunization  Orders:   Flu vaccine, recombinant, trivalent, inj  Encounter for immunization  Orders:   Pfizer Comirnaty  Covid-19 Vaccine 82yrs & older    Body mass index is 32.95 kg/m.   Meds ordered this encounter  Medications   phentermine 37.5 MG capsule    Sig: Take 1  capsule (37.5 mg total) by mouth every morning.    Dispense:  30 capsule    Refill:  0   naproxen (NAPROSYN) 500 MG tablet    Sig: Take 1 tablet (500 mg total) by mouth 2 (two) times daily with a meal.    Dispense:  60 tablet    Refill:  1    Orders Placed This Encounter  Procedures   Flu vaccine, recombinant, trivalent, inj   Pfizer Comirnaty  Covid-19 Vaccine 41yrs & older   POCT glycosylated hemoglobin (Hb A1C)   POCT Lipid Panel     I,Marla I Leal-Borjas,acting as a scribe for Abigail Free, MD.,have documented all relevant documentation on the behalf of Abigail Free, MD,as directed by  Abigail Free, MD while in the presence of Abigail Free, MD.   Follow-up: Return in about 3 months (around 11/26/2024) for chronic follow up.  An After Visit Summary was printed and given to the patient.  I attest that I have reviewed this visit and agree with the plan scribed by my staff.   Abigail Free, MD [facility] [phone]

## 2024-08-26 NOTE — Assessment & Plan Note (Addendum)
-   Cholesterol at goal without medicines.  - Recommend continue to work on eating healthy diet and exercise.  Orders:   POCT Lipid Panel

## 2024-08-26 NOTE — Assessment & Plan Note (Addendum)
 Recommend continue to work on eating healthy diet and exercise. HBA1C 5.9.   Orders:   POCT glycosylated hemoglobin (Hb A1C)

## 2024-08-26 NOTE — Assessment & Plan Note (Addendum)
 Binge eating disorder with emotional eating and weight gain. Vyvanse  effective but caused insomnia and agitation. Phentermine considered as alternative. - Prescribed phentermine as appetite suppressant. - Advised morning dosing to avoid sleep issues. - Instructed to verify insurance coverage for weight loss medications. - Encouraged dietary modifications with fruits, vegetables, and protein. - Discussed intermittent fasting for calorie reduction. - Continue counseling with Powell Oto, dietician

## 2024-08-26 NOTE — Patient Instructions (Addendum)
  VISIT SUMMARY: Today, we addressed the hard knot on the back of your right leg and discussed your concerns about weight management and disordered eating. We also reviewed your current medications and preventive care.  YOUR PLAN: BAKER'S CYST, RIGHT LEG:  -Take naproxen twice daily for 1-2 weeks to reduce inflammation. -If the pain does not improve with treatment, we may consider referring you to an orthopedic specialist.  BINGE EATING DISORDER AND OVERWEIGHT: You have a history of binge eating disorder with emotional eating and weight gain. Vyvanse  was effective but caused sleep issues. -Start taking phentermine as an appetite suppressant. Take it in the morning to avoid sleep problems. -Check with your insurance to see if they cover weight loss medications. -Make dietary changes by including more fruits, vegetables, and protein in your meals. -Consider intermittent fasting to help reduce calorie intake. -Continue your counseling sessions with Powell Oto, dietician                       Contains text generated by [employer].                                 Contains text generated by [employer].

## 2024-10-23 ENCOUNTER — Other Ambulatory Visit: Payer: Self-pay | Admitting: Family Medicine

## 2024-10-23 DIAGNOSIS — F411 Generalized anxiety disorder: Secondary | ICD-10-CM

## 2024-10-23 NOTE — Telephone Encounter (Unsigned)
 Copied from CRM #[id number]. Topic: Clinical - Medication Refill >> Oct 23, 2024 12:31 PM Donna E wrote: Medication: clonazePAM  (KLONOPIN ) 0.5 MG tablet [[id number]] DISCONTINUED  Has the patient contacted their pharmacy? No (Agent: If no, request that the patient contact the pharmacy for the refill. If patient does not wish to contact the pharmacy document the reason why and proceed with request.) (Agent: If yes, when and what did the pharmacy advise?)  This is the patient's preferred pharmacy:   [id number] - [place], [place] - [address] AT [facility] [address] [place] [address] Phone: [phone] Fax: [phone]   Is this the correct pharmacy for this prescription? Yes If no, delete pharmacy and type the correct one.   Has the prescription been filled recently? Yes  Is the patient out of the medication? No  Has the patient been seen for an appointment in the last year OR does the patient have an upcoming appointment? Yes  Can we respond through MyChart? No  Agent: Please be advised that Rx refills may take up to 3 business days. We ask that you follow-up with your pharmacy.

## 2024-10-28 ENCOUNTER — Telehealth: Payer: Self-pay

## 2024-10-28 ENCOUNTER — Other Ambulatory Visit: Payer: Self-pay

## 2024-10-28 MED ORDER — CLONAZEPAM 0.5 MG PO TABS: 0.2500 mg | ORAL_TABLET | Freq: Every day | ORAL | 2 refills | Status: DC

## 2024-10-28 NOTE — Telephone Encounter (Unsigned)
 Copied from CRM #[id number]. Topic: Clinical - Prescription Issue >> Oct 28, 2024 11:24 AM Joesph NOVAK wrote: Reason for CRM: patient is requesting a update on her prescription request  clonazePAM  (KLONOPIN ) 0.5 MG tablet. Please fu with patient.

## 2024-11-10 ENCOUNTER — Telehealth: Payer: Self-pay

## 2024-11-10 NOTE — Telephone Encounter (Addendum)
 Dr Sherre recommended to talk with HR and fill out work unumprovident. She did talk with them and they did not know what to do. She wants to be sure is nothing that she has do or be check or do bloodwork or get a new hepatitis b vaccine series. She got hepatitis B series few years ago.    Dr Sherre stated that she should be fine because she does not have any open wounds or any skin lacerations and her skin is intact. She did the right thing to wash it with soap and water. Patient verbalized to understand.  Copied from CRM [id number]. 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 She has no open cuts or anything and did wash her hands right away. Wants to know what else she needs to do. [phone]

## 2024-11-21 ENCOUNTER — Other Ambulatory Visit: Payer: Self-pay | Admitting: Family Medicine

## 2024-11-30 ENCOUNTER — Encounter: Payer: Self-pay | Admitting: Family Medicine

## 2024-11-30 ENCOUNTER — Ambulatory Visit: Admitting: Family Medicine

## 2024-11-30 VITALS — BP 120/70 | HR 76 | Temp 97.3°F | Resp 16 | Ht 65.0 in | Wt 205.0 lb

## 2024-11-30 DIAGNOSIS — F50811 Binge eating disorder, moderate: Secondary | ICD-10-CM | POA: Diagnosis not present

## 2024-11-30 DIAGNOSIS — F330 Major depressive disorder, recurrent, mild: Secondary | ICD-10-CM | POA: Diagnosis not present

## 2024-11-30 DIAGNOSIS — E782 Mixed hyperlipidemia: Secondary | ICD-10-CM

## 2024-11-30 DIAGNOSIS — L821 Other seborrheic keratosis: Secondary | ICD-10-CM | POA: Diagnosis not present

## 2024-11-30 DIAGNOSIS — R7303 Prediabetes: Secondary | ICD-10-CM | POA: Diagnosis not present

## 2024-11-30 DIAGNOSIS — B078 Other viral warts: Secondary | ICD-10-CM | POA: Diagnosis not present

## 2024-11-30 DIAGNOSIS — F411 Generalized anxiety disorder: Secondary | ICD-10-CM

## 2024-11-30 MED ORDER — CLONAZEPAM 0.5 MG PO TABS: 0.5000 mg | ORAL_TABLET | Freq: Every day | ORAL | 2 refills | Status: AC

## 2024-11-30 MED ORDER — ESCITALOPRAM OXALATE 20 MG PO TABS: 20.0000 mg | ORAL_TABLET | Freq: Every day | ORAL | 0 refills | Status: AC

## 2024-11-30 NOTE — Assessment & Plan Note (Addendum)
 Managed with Lexapro  and clonazepam . Lexapro  dosage adjusted due to pharmacy issues. Counseling beneficial for stress management. - Prescribed Lexapro  20 mg 1/2 daily. - Continue clonazepam  as needed. - Encouraged continuation of counseling for stress management. Orders:   clonazePAM  (KLONOPIN ) 0.5 MG tablet; Take 1 tablet (0.5 mg total) by mouth daily.   escitalopram  (LEXAPRO ) 20 MG tablet; Take 1 tablet (20 mg total) by mouth daily.

## 2024-11-30 NOTE — Assessment & Plan Note (Addendum)
 The current medical regimen is effective;  continue present plan and medications.  Patient is taking Klonopin  0.5 mg once daily as needed,  Lexapro  20 mg daily.  Orders:   escitalopram  (LEXAPRO ) 20 MG tablet; Take 1 tablet (20 mg total) by mouth daily.

## 2024-11-30 NOTE — Assessment & Plan Note (Addendum)
Well controlled. No changes to medicines.  Continue to work on eating a healthy diet and exercise.    

## 2024-11-30 NOTE — Assessment & Plan Note (Addendum)
Hemoglobin A1c 5.9%, 3 month avg of blood sugars, is in prediabetic range.  In order to prevent progression to diabetes, recommend low carb diet and regular exercise

## 2024-11-30 NOTE — Progress Notes (Signed)
 "  Subjective:  Patient ID: Lindsay Gregory, female    DOB: 04-02-1961  Age: 64 y.o. MRN: [id number]  Chief Complaint  Patient presents with   Medical Management of Chronic Issues    HPI: Discussed the use of AI scribe software for clinical note transcription with the patient, who gave verbal consent to proceed.  History of Present Illness Lindsay Gregory is a 64 year old female who presents with a nodule on her left index finger.  Left index finger nodule - Nodule present for at least one month - Increasing in size - Causes discomfort when bumped against objects - Covered with a Band-Aid - Uncertain if lesion is a wart or cyst  Back lesion - Lesion present on back - Unable to visualize lesion - No pruritus or discomfort - Uncertain if dermatological evaluation is needed  Anxiety and depression - Currently taking Lexapro , half of a 20 mg tablet daily - Uses clonazepam , half tablet as needed - Issues with pharmacy regarding clonazepam  refill timing - Returning to counseling for stress management - Feeling overwhelmed by work and personal responsibilities - Increased stress during this time of year due to work demands  Weight management - Prescribed phentermine , takes occasionally due to stimulating effects - Previously tried Vyvanse , similar effects to phentermine  - Working on dietary improvements, eating healthier for past two weeks  Inflammation - Taking naproxen  for inflammation - Finds naproxen  helpful but does not take daily  Psychosocial stressors - 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, [employer] to manage financial stress, resulting in significant reduction in stress levels - Experienced emotional distress last year due to loss of two dogs - Currently has ponies, goats, and a dog       11/30/2024    3:19 PM 08/26/2024    3:37 PM 05/19/2024   12:01 PM 02/24/2024    2:04 PM 08/16/2023    7:32 AM  Depression screen PHQ 2/9  Decreased Interest 0 0 0 0 0   Down, Depressed, Hopeless 0 0 1 1 0  PHQ - 2 Score 0 0 1 1 0  Altered sleeping 0 0  0 0  Tired, decreased energy 0 1  1 0  Change in appetite 0 3  3 3   Feeling bad or failure about yourself  0 0  1 1  Trouble concentrating 0 0  0 0  Moving slowly or fidgety/restless 0 0  0 0  Suicidal thoughts 0 0  0 0  PHQ-9 Score 0 4   6  4    Difficult doing work/chores Not difficult at all Not difficult at all  Somewhat difficult Not difficult at all     Data saved with a previous flowsheet row definition        08/26/2024    3:37 PM 02/24/2024    2:05 PM 08/16/2023    7:33 AM 05/07/2023    9:40 AM  GAD 7 : Generalized Anxiety Score  Nervous, Anxious, on Edge 1  1  3  1    Control/stop worrying 1  1  3  1    Worry too much - different things 1  1  3  1    Trouble relaxing 0  0  2  0   Restless 0  0  0  0   Easily annoyed or irritable 1  1  0  0   Afraid - awful might happen 1  1  1   0   Total GAD 7 Score 5  5 12 3   Anxiety Difficulty Somewhat difficult Somewhat difficult Not difficult at all Somewhat difficult     Data saved with a previous flowsheet row definition        05/19/2024   12:00 PM  Fall Risk   Falls in the past year? 0  Number falls in past yr: 0  Injury with Fall? 0   Follow up Falls evaluation completed     Data saved with a previous flowsheet row definition    Patient Care Team: Sherre Clapper, MD as PCP - General (Family Medicine) Sherre Clapper, MD as Referring Physician (Family Medicine)   Review of Systems  Constitutional:  Negative for chills, fatigue and fever.  HENT:  Negative for congestion, ear pain and sore throat.   Respiratory:  Negative for cough and shortness of breath.   Cardiovascular:  Negative for chest pain.  Gastrointestinal:  Negative for abdominal pain, constipation, diarrhea, nausea and vomiting.  Genitourinary:  Negative for dysuria and urgency.  Musculoskeletal:  Negative for arthralgias and myalgias.  Skin:  Negative for rash.   Neurological:  Negative for dizziness and headaches.  Psychiatric/Behavioral:  Negative for dysphoric mood. The patient is not nervous/anxious.     Medications Ordered Prior to Encounter[1] Past Medical History:  Diagnosis Date   Anxiety    Hyperlipidemia    Plantar fasciitis    Prediabetes    Past Surgical History:  Procedure Laterality Date   ABLATION  02/03/2007   CERVICAL SPINE SURGERY  2013    Family History  Problem Relation Age of Onset   Atrial fibrillation Father    Ovarian cancer Other        Maternal Great Aunt   Sleep apnea Neg Hx    Breast cancer Neg Hx    Colon polyps Neg Hx    Colon cancer Neg Hx    Esophageal cancer Neg Hx    Rectal cancer Neg Hx    Stomach cancer Neg Hx    Social History   Socioeconomic History   Marital status: Divorced    Spouse name: Not on file   Number of children: Not on file   Years of education: Not on file   Highest education level: Not on file  Occupational History   Not on file  Tobacco Use   Smoking status: Never   Smokeless tobacco: Never  Vaping Use   Vaping status: Never Used  Substance and Sexual Activity   Alcohol use: Not Currently    Comment: rarely   Drug use: Never   Sexual activity: Not Currently    Birth control/protection: Surgical    Comment: ablation  Other Topics Concern   Not on file  Social History Narrative   Not on file   Social Drivers of Health   Tobacco Use: Low Risk (11/30/2024)   Patient History    Smoking Tobacco Use: Never    Smokeless Tobacco Use: Never    Passive Exposure: Not on file  Financial Resource Strain: Low Risk (01/22/2023)   Overall Financial Resource Strain (CARDIA)    Difficulty of Paying Living Expenses: Not hard at all  Food Insecurity: No Food Insecurity (02/24/2024)   Hunger Vital Sign    Worried About Running Out of Food in the Last Year: Never true    Ran Out of Food in the Last Year: Never true  Transportation Needs: No Transportation Needs (02/24/2024)    PRAPARE - Transportation    Lack of Transportation (Medical): No    Lack of  Transportation (Non-Medical): No  Physical Activity: Sufficiently Active (05/07/2023)   Exercise Vital Sign    Days of Exercise per Week: 7 days    Minutes of Exercise per Session: 60 min  Recent Concern: Physical Activity - Inactive (05/07/2023)   Exercise Vital Sign    Days of Exercise per Week: 0 days    Minutes of Exercise per Session: 60 min  Stress: No Stress Concern Present (01/22/2023)   [employer] of Occupational Health - Occupational Stress Questionnaire    Feeling of Stress : Not at all  Social Connections: Socially Isolated (01/22/2023)   Social Connection and Isolation Panel    Frequency of Communication with Friends and Family: Three times a week    Frequency of Social Gatherings with Friends and Family: Three times a week    Attends Religious Services: Never    Active Member of Clubs or Organizations: No    Attends Banker Meetings: Never    Marital Status: Divorced  Depression (PHQ2-9): Low Risk (11/30/2024)   Depression (PHQ2-9)    PHQ-2 Score: 0  Alcohol Screen: Not on file  Housing: Low Risk (02/24/2024)   Housing Stability Vital Sign    Unable to Pay for Housing in the Last Year: No    Number of Times Moved in the Last Year: 0    Homeless in the Last Year: No  Utilities: Not At Risk (02/24/2024)   [employer] Utilities    Threatened with loss of utilities: No  Health Literacy: Adequate Health Literacy (02/24/2024)   B1300 Health Literacy    Frequency of need for help with medical instructions: Never    Objective:  BP 120/70   Pulse 76   Temp (!) 97.3 F (36.3 C)   Resp 16   Ht 5' 5 (1.651 m)   Wt 205 lb (93 kg)   LMP  (LMP Unknown)   SpO2 97%   BMI 34.11 kg/m      11/30/2024    3:13 PM 08/26/2024    3:33 PM 07/27/2024    3:45 PM  BP/Weight  Systolic BP 120 112 109  Diastolic BP 70 70 68  Wt. (Lbs) 205 198 194  BMI 34.11 kg/m2 32.95 kg/m2 32.28 kg/m2     Physical Exam Vitals reviewed.  Constitutional:      Appearance: Normal appearance. She is normal weight.  Neck:     Vascular: No carotid bruit.  Cardiovascular:     Rate and Rhythm: Normal rate and regular rhythm.     Heart sounds: Normal heart sounds.  Pulmonary:     Effort: Pulmonary effort is normal. No respiratory distress.     Breath sounds: Normal breath sounds.  Abdominal:     General: Abdomen is flat. Bowel sounds are normal.     Palpations: Abdomen is soft.     Tenderness: There is no abdominal tenderness.  Skin:    Findings: Lesion (left hand second digit wart. SK on posterior left shoulder.) present.  Neurological:     Mental Status: She is alert and oriented to person, place, and time.  Psychiatric:        Mood and Affect: Mood normal.        Behavior: Behavior normal.          Cryotherapy:   Index finger of left hand: wart 2 cycles x 6 seconds.   Posterior left shoulder: SK Cryo x 2 for 6 seconds.   Lab Results  Component Value Date   WBC 9.0 07/27/2024  HGB 12.7 07/27/2024   HCT 39.1 07/27/2024   PLT 265 07/27/2024   GLUCOSE 95 07/27/2024   CHOL 204 (H) 02/24/2024   TRIG 59 02/24/2024   HDL 89 02/24/2024   LDLCALC 104 (H) 02/24/2024   ALT 11 07/27/2024   AST 18 07/27/2024   NA 139 07/27/2024   K 4.0 07/27/2024   CL 102 07/27/2024   CREATININE 0.98 07/27/2024   BUN 15 07/27/2024   CO2 23 07/27/2024   TSH 1.760 02/24/2024   HGBA1C 5.9 08/26/2024    Results for orders placed or performed in visit on 08/26/24  POCT glycosylated hemoglobin (Hb A1C)   Collection Time: 08/26/24  3:50 PM  Result Value Ref Range   Hemoglobin A1C     HbA1c POC (<> result, manual entry) 5.9 4.0 - 5.6 %   HbA1c, POC (prediabetic range)     HbA1c, POC (controlled diabetic range)    POCT Lipid Panel   Collection Time: 08/26/24  3:52 PM  Result Value Ref Range   TC 144    HDL 58    TRG 73    LDL 71    Non-HDL 86    TC/HDL    .  Assessment & Plan:    Assessment & Plan Prediabetes Hemoglobin A1c 5.9%, 3 month avg of blood sugars, is in prediabetic range.  In order to prevent progression to diabetes, recommend low carb diet and regular exercise      Mixed hyperlipidemia Well controlled.  No changes to medicines.  Continue to work on eating a healthy diet and exercise.       GAD (generalized anxiety disorder) Managed with Lexapro  and clonazepam . Lexapro  dosage adjusted due to pharmacy issues. Counseling beneficial for stress management. - Prescribed Lexapro  20 mg 1/2 daily. - Continue clonazepam  as needed. - Encouraged continuation of counseling for stress management. Orders:   clonazePAM  (KLONOPIN ) 0.5 MG tablet; Take 1 tablet (0.5 mg total) by mouth daily.   escitalopram  (LEXAPRO ) 20 MG tablet; Take 1 tablet (20 mg total) by mouth daily.  Depression, major, recurrent, mild The current medical regimen is effective;  continue present plan and medications.  Patient is taking Klonopin  0.5 mg once daily as needed,  Lexapro  20 mg daily.  Orders:   escitalopram  (LEXAPRO ) 20 MG tablet; Take 1 tablet (20 mg total) by mouth daily.   Moderate binge-eating disorder Managed with phentermine  on days off due to side effects. Vyvanse  previously tried with similar effects. Dietary changes and improved eating habits noted. - Continue phentermine  on days off as needed. - Encouraged continuation of dietary strategies and counseling for stress management.    Other viral warts Viral warts (left index finger) Viral wart on left index finger increasing in size and causing discomfort.  - Performed cryotherapy on the wart on the left index finger.   Seborrheic keratoses - Performed cryotherapy on SK on her back.       Body mass index is 34.11 kg/m.    Meds ordered this encounter  Medications   clonazePAM  (KLONOPIN ) 0.5 MG tablet    Sig: Take 1 tablet (0.5 mg total) by mouth daily.    Dispense:  30 tablet    Refill:  2    This  request is for a new prescription for a controlled substance as required by Federal/State law.   escitalopram  (LEXAPRO ) 20 MG tablet    Sig: Take 1 tablet (20 mg total) by mouth daily.    Dispense:  90 tablet  Refill:  0    No orders of the defined types were placed in this encounter.     Lindsay Gregory,acting as a scribe for Abigail Free, MD.,have documented all relevant documentation on the behalf of Abigail Free, MD,as directed by  Abigail Free, MD while in the presence of Abigail Free, MD.   Follow-up: Return in about 3 months (around 02/28/2025) for chronic follow up.  An After Visit Summary was printed and given to the patient.  I attest that I have reviewed this visit and agree with the plan scribed by my staff.   Abigail Free, MD [facility] [phone]       [1]  Current Outpatient Medications on File Prior to Visit  Medication Sig Dispense Refill   lisdexamfetamine  (VYVANSE ) 20 MG capsule Take 1 capsule (20 mg total) by mouth daily. (Patient taking differently: Take 20 mg by mouth daily as needed.) 30 capsule 0   Multiple Vitamin (MULTIVITAMIN PO) Take by mouth daily. (Patient taking differently: Take 1 tablet by mouth daily.)     naproxen  (NAPROSYN ) 500 MG tablet TAKE 1 TABLET BY MOUTH 2 TIMES DAILY WITH A MEAL. 60 tablet 1   phentermine  37.5 MG capsule Take 1 capsule (37.5 mg total) by mouth every morning. 30 capsule 0   VITAMIN D  PO Take by mouth daily. (Patient taking differently: Take 1 tablet by mouth daily.)     No current facility-administered medications on file prior to visit.   "

## 2024-12-04 NOTE — Assessment & Plan Note (Addendum)
 Managed with phentermine  on days off due to side effects. Vyvanse  previously tried with similar effects. Dietary changes and improved eating habits noted. - Continue phentermine  on days off as needed. - Encouraged continuation of dietary strategies and counseling for stress management.

## 2024-12-07 ENCOUNTER — Ambulatory Visit (HOSPITAL_BASED_OUTPATIENT_CLINIC_OR_DEPARTMENT_OTHER)

## 2024-12-07 ENCOUNTER — Encounter (HOSPITAL_BASED_OUTPATIENT_CLINIC_OR_DEPARTMENT_OTHER): Payer: Self-pay | Admitting: Emergency Medicine

## 2024-12-07 ENCOUNTER — Ambulatory Visit (INDEPENDENT_AMBULATORY_CARE_PROVIDER_SITE_OTHER): Admit: 2024-12-07 | Discharge: 2024-12-07 | Disposition: A | Discharge status: Home / Self Care | Admitting: Radiology

## 2024-12-07 ENCOUNTER — Ambulatory Visit (HOSPITAL_BASED_OUTPATIENT_CLINIC_OR_DEPARTMENT_OTHER)
Admission: EM | Admit: 2024-12-07 | Discharge: 2024-12-07 | Disposition: A | Discharge status: Home / Self Care | Attending: Family Medicine | Admitting: Family Medicine

## 2024-12-07 DIAGNOSIS — S300XXA Contusion of lower back and pelvis, initial encounter: Secondary | ICD-10-CM

## 2024-12-07 DIAGNOSIS — M545 Low back pain, unspecified: Secondary | ICD-10-CM

## 2024-12-07 DIAGNOSIS — M25552 Pain in left hip: Secondary | ICD-10-CM | POA: Diagnosis not present

## 2024-12-07 DIAGNOSIS — R0789 Other chest pain: Secondary | ICD-10-CM

## 2024-12-07 DIAGNOSIS — S20222A Contusion of left back wall of thorax, initial encounter: Secondary | ICD-10-CM

## 2024-12-07 DIAGNOSIS — W010XXA Fall on same level from slipping, tripping and stumbling without subsequent striking against object, initial encounter: Secondary | ICD-10-CM

## 2024-12-07 NOTE — Discharge Instructions (Addendum)
 Back and rib pain after a fall: X-rays are negative.  Patient has contusions or bruising secondary to the fall and impact.  May use naproxen  500 mg twice daily with food for the pain.  Encouraged use of ice therapy (ice packs 30 minutes on and 30 minutes off and repeat as often as needed) for 24 to 48 hours.  Then switch to heat therapy (heat packs 30 minutes on and 30 minutes off and repeat as often as needed).   Follow-up if symptoms do not improve, worsen or new symptoms occur.

## 2024-12-07 NOTE — ED Provider Notes (Addendum)
 " Lindsay Gregory    CSN: [id number] Arrival date & time: 12/07/24  1303      History   Chief Complaint Chief Complaint  Patient presents with   Appointment    HPI Lindsay Gregory is a 64 y.o. female.   64 year old female who was bending over and feeding her horses on 12/06/2024 and she fell and hit metal water bucket.  She is having left flank and rib pain, left lower back pain and left buttock pain.  She does have moderate bruising noted on her left posterior ribs and some on her left buttock.  She has taken Naprosyn  with some relief of her symptoms.      Past Medical History:  Diagnosis Date   Anxiety    Hyperlipidemia    Plantar fasciitis    Prediabetes     Patient Active Problem List   Diagnosis Date Noted   Synovial cyst of popliteal space 08/26/2024   Overweight with body mass index (BMI) of 28 to 28.9 in adult 03/01/2024   Screening for colon cancer 03/01/2024   Immunization due 03/01/2024   OSA on CPAP 03/01/2024   Right hand pain 10/24/2023   Ganglion cyst of dorsum of right wrist 10/24/2023   Apnea 05/12/2023   Plantar fascial fibromatosis 05/08/2023   Binge eating disorder 01/27/2023   Abnormal weight gain 01/26/2023   Class 1 obesity due to excess calories with serious comorbidity and body mass index (BMI) of 33.0 to 33.9 in adult 06/17/2022   Prediabetes 06/14/2022   Mixed hyperlipidemia 06/14/2022   Depression, major, recurrent, mild 10/22/2021   GAD (generalized anxiety disorder) 12/20/2019   Arthritis 05/29/2019    Past Surgical History:  Procedure Laterality Date   ABLATION  02/03/2007   CERVICAL SPINE SURGERY  2013    OB History     Gravida  1   Para  1   Term  1   Preterm  0   AB  0   Living  1      SAB  0   IAB  0   Ectopic  0   Multiple  0   Live Births  1            Home Medications    Prior to Admission medications  Medication Sig Start Date End Date Taking? Authorizing Provider  clonazePAM   (KLONOPIN ) 0.5 MG tablet Take 1 tablet (0.5 mg total) by mouth daily. 11/30/24   Cox, Abigail, MD  escitalopram  (LEXAPRO ) 20 MG tablet Take 1 tablet (20 mg total) by mouth daily. 11/30/24   CoxAbigail, MD  lisdexamfetamine  (VYVANSE ) 20 MG capsule Take 1 capsule (20 mg total) by mouth daily. Patient taking differently: Take 20 mg by mouth daily as needed. 02/24/24   CoxAbigail, MD  Multiple Vitamin (MULTIVITAMIN PO) Take by mouth daily. Patient taking differently: Take 1 tablet by mouth daily.    [provider]  naproxen  (NAPROSYN ) 500 MG tablet TAKE 1 TABLET BY MOUTH 2 TIMES DAILY WITH A MEAL. 11/23/24   CoxAbigail, MD  phentermine  37.5 MG capsule Take 1 capsule (37.5 mg total) by mouth every morning. 08/26/24   Sherre Abigail, MD  VITAMIN D  PO Take by mouth daily. Patient taking differently: Take 1 tablet by mouth daily.    [provider]    Family History Family History  Problem Relation Age of Onset   Atrial fibrillation Father    Ovarian cancer Other  Maternal Great Aunt   Sleep apnea Neg Hx    Breast cancer Neg Hx    Colon polyps Neg Hx    Colon cancer Neg Hx    Esophageal cancer Neg Hx    Rectal cancer Neg Hx    Stomach cancer Neg Hx     Social History Social History[1]   Allergies   Amoxicillin   Review of Systems Review of Systems  Constitutional:  Negative for fever.  Respiratory:  Negative for cough.   Cardiovascular:  Negative for chest pain.  Gastrointestinal:  Negative for abdominal pain, constipation, diarrhea, nausea and vomiting.  Genitourinary:  Positive for flank pain.  Musculoskeletal:  Positive for back pain. Negative for arthralgias.  Skin:  Negative for color change and rash.  Neurological:  Negative for syncope.  All other systems reviewed and are negative.    Physical Exam Triage Vital Signs ED Triage Vitals  Encounter Vitals Group     BP 12/07/24 1322 126/75     Girls Systolic BP Percentile --      Girls  Diastolic BP Percentile --      Boys Systolic BP Percentile --      Boys Diastolic BP Percentile --      Pulse Rate 12/07/24 1322 66     Resp 12/07/24 1322 17     Temp 12/07/24 1322 98.4 F (36.9 C)     Temp Source 12/07/24 1322 Oral     SpO2 12/07/24 1322 95 %     Weight --      Height --      Head Circumference --      Peak Flow --      Pain Score 12/07/24 1321 9     Pain Loc --      Pain Education --      Exclude from Growth Chart --    No data found.  Updated Vital Signs BP 126/75 (BP Location: Right Arm)   Pulse 66   Temp 98.4 F (36.9 C) (Oral)   Resp 17   LMP  (LMP Unknown)   SpO2 95%   Visual Acuity Right Eye Distance:   Left Eye Distance:   Bilateral Distance:    Right Eye Near:   Left Eye Near:    Bilateral Near:     Physical Exam Vitals and nursing note reviewed.  Constitutional:      General: She is not in acute distress.    Appearance: She is well-developed. She is not ill-appearing or toxic-appearing.  HENT:     Head: Normocephalic and atraumatic.     Right Ear: External ear normal.     Left Ear: External ear normal.     Nose: Nose normal.     Mouth/Throat:     Lips: Pink.     Mouth: Mucous membranes are moist.  Eyes:     Conjunctiva/sclera: Conjunctivae normal.     Pupils: Pupils are equal, round, and reactive to light.  Cardiovascular:     Rate and Rhythm: Normal rate and regular rhythm.     Heart sounds: S1 normal and S2 normal. No murmur heard. Pulmonary:     Effort: Pulmonary effort is normal. No respiratory distress.     Breath sounds: Normal breath sounds. No decreased breath sounds, wheezing, rhonchi or rales.  Chest:     Chest wall: Tenderness (Left lower posterior ribs) present. No mass, lacerations, deformity, swelling, crepitus or edema. There is no dullness to percussion.  Musculoskeletal:  General: No swelling.     Cervical back: Tenderness and bony tenderness present. No swelling, edema, deformity, erythema, signs of  trauma, lacerations, rigidity, spasms, torticollis or crepitus. Pain with movement present. Normal range of motion.     Thoracic back: Tenderness and bony tenderness present. No swelling, edema, deformity, signs of trauma, lacerations or spasms. Normal range of motion. No scoliosis.     Lumbar back: Tenderness and bony tenderness present. No swelling, edema, deformity, signs of trauma, lacerations or spasms. Normal range of motion. No scoliosis.  Skin:    General: Skin is warm and dry.     Capillary Refill: Capillary refill takes less than 2 seconds.     Findings: No rash.  Neurological:     Mental Status: She is alert and oriented to person, place, and time.  Psychiatric:        Mood and Affect: Mood normal.         UC Treatments / Results  Labs (all labs ordered are listed, but only abnormal results are displayed) Comprehensive metabolic panel with GFR: 07/27/24:    Component Ref Range & Units (hover) 4 mo ago (07/27/24)  Glucose 95  BUN 15  Creatinine, Ser 0.98  eGFR 65  BUN/Creatinine Ratio 15  Sodium 139  Potassium 4.0  Chloride 102  CO2 23  Calcium 9.4  Total Protein 6.7  Albumin 4.5  Globulin, Total 2.2  Bilirubin Total <0.2  Alkaline Phosphatase 72  Comment:               **Please note reference interval change**  AST 18  ALT 11  Resulting Agency [facility]      EKG   Radiology DG Hip Unilat With Pelvis 2-3 Views Left Result Date: 12/07/2024 CLINICAL DATA:  Fall yesterday.  Left chest, back and hip pain. EXAM: DG HIP (WITH OR WITHOUT PELVIS) 2-3V LEFT COMPARISON:  None Available. FINDINGS: The mineralization and alignment are normal. There is no evidence of acute fracture or dislocation. No evidence of femoral head osteonecrosis. The hip joint spaces are preserved. There is lower lumbar spondylosis. No significant soft tissue abnormalities are identified. IMPRESSION: No evidence of acute fracture or dislocation. Lower lumbar spondylosis. Electronically Signed    By: Elsie Perone M.D.   On: 12/07/2024 14:09   DG Ribs Unilateral W/Chest Left Result Date: 12/07/2024 CLINICAL DATA:  Fall yesterday.  Left chest, back and hip pain. EXAM: LEFT RIBS AND CHEST - 3+ VIEW COMPARISON:  None Available. FINDINGS: The heart size and mediastinal contours are normal. The lungs are clear. No pleural effusion or pneumothorax. No evidence of acute left-sided rib fracture or focal rib abnormality. There are postsurgical changes from previous lower cervical fusion. IMPRESSION: No evidence of acute left-sided rib fracture, pleural effusion or pneumothorax. Electronically Signed   By: Elsie Perone M.D.   On: 12/07/2024 14:09    Procedures Procedures (including critical Gregory time)  Medications Ordered in UC Medications - No data to display  Initial Impression / Assessment and Plan / UC Course  I have reviewed the triage vital signs and the nursing notes.  Pertinent labs & imaging results that were available during my Gregory of the patient were reviewed by me and considered in my medical decision making (see chart for details).  Plan of Gregory (see discharge instructions for additional patient precautions and education): Back and rib pain after a fall: X-rays are negative.  Patient has contusions or bruising secondary to the fall and impact.  May use naproxen   500 mg twice daily with food for the pain.  She declined any cyclobenzaprine and will use the Naprosyn  that she has at home.  Encouraged use of ice therapy (ice packs 30 minutes on and 30 minutes off and repeat as often as needed) for 24 to 48 hours.  Then switch to heat therapy (heat packs 30 minutes on and 30 minutes off and repeat as often as needed).   Follow-up if symptoms do not improve, worsen or new symptoms occur.  Patient asked for a work excuse for her second job, which is at [employer].  She wanted to be off 12/10/24 through 12/13/24.  I explained that we can only provide up to 3 days of work excuses and then she  either needs to be reevaluated or see someone else.  I encouraged her to contact her primary Gregory as they might be able to provide an excuse.  I reviewed the plan of Gregory with the patient and/or the patient's guardian.  The patient and/or guardian had time to ask questions and acknowledged that the questions were answered.  Final Clinical Impressions(s) / UC Diagnoses   Final diagnoses:  Rib pain on left side  Acute left-sided low back pain without sciatica  Acute hip pain, left  Contusion of left buttock  Contusion of left side of mid back, initial encounter  Fall on same level from slipping, tripping or stumbling, initial encounter     Discharge Instructions      Back and rib pain after a fall: X-rays are negative.  Patient has contusions or bruising secondary to the fall and impact.  May use naproxen  500 mg twice daily with food for the pain.  Encouraged use of ice therapy (ice packs 30 minutes on and 30 minutes off and repeat as often as needed) for 24 to 48 hours.  Then switch to heat therapy (heat packs 30 minutes on and 30 minutes off and repeat as often as needed).   Follow-up if symptoms do not improve, worsen or new symptoms occur.     ED Prescriptions   None    PDMP not reviewed this encounter.    Ival Domino, FNP 12/07/24 1416     [1]  Social History Tobacco Use   Smoking status: Never   Smokeless tobacco: Never  Vaping Use   Vaping status: Never Used  Substance Use Topics   Alcohol use: Not Currently    Comment: rarely   Drug use: Never     Ival Domino, FNP 12/07/24 1421  "

## 2024-12-07 NOTE — ED Triage Notes (Signed)
 Pt fell yesterday when bent over feeding her horses and fell onto a water bucket. Reports pain on left side, back and left buttock. Pt took Naproxen .

## 2024-12-08 ENCOUNTER — Ambulatory Visit: Payer: Self-pay

## 2024-12-08 NOTE — Telephone Encounter (Signed)
 FYI Only or Action Required?: Action required by provider: requests work note.  Patient was last seen in primary care on 11/30/2024 by Sherre Clapper, MD.  Called Nurse Triage reporting Fall.  Symptoms began several days ago.  Interventions attempted: Other: Seen in UC 12/07/24.[name]  Symptoms are: gradually worsening. Needs 2 work notes - for school - light duty x 1 week. [employer] - out of work until Monday 12/14/24. Please advise pt.  Triage Disposition: Call PCP Now  Patient/caregiver understands and will follow disposition?: YesReason for Triage: Patient fell on Sunday and still experiencing pain   Reason for Disposition  [1] Caller has URGENT question AND [2] triager unable to answer question  Answer Assessment - Initial Assessment Questions 1. MECHANISM: How did the fall happen?     [place] outside 2. DOMESTIC VIOLENCE AND ELDER ABUSE SCREENING: Did you fall because someone pushed you or tried to hurt you? If Yes, ask: Are you safe now?     no 3. ONSET: When did the fall happen? (e.g., minutes, hours, or days ago)     Sunday 4. LOCATION: What part of the body hit the ground? (e.g., back, buttocks, head, hips, knees, hands, head, stomach)     ribs 5. INJURY: Did you hurt (injure) yourself when you fell? If Yes, ask: What did you injure? Tell me more about this? (e.g., body area; type of injury; pain severity)     Fell on her side 6. PAIN: Is there any pain? If Yes, ask: How bad is the pain? (e.g., Scale 0-10; or none, mild,      severe 7. SIZE: For cuts, bruises, or swelling, ask: How large is it? (e.g., inches or centimeters)      N/a 8. PREGNANCY: Is there any chance you are pregnant? When was your last menstrual period?     no 9. OTHER SYMPTOMS: Do you have any other symptoms? (e.g., dizziness, fever, weakness; new-onset or worsening).      no 10. CAUSE: What do you think caused the fall (or falling)? (e.g., dizzy spell, tripped)        slipped  Protocols used: Falls and [facility]

## 2024-12-08 NOTE — Telephone Encounter (Signed)
 Called patient and let her know that we will do the doctor's note.

## 2024-12-09 NOTE — Telephone Encounter (Signed)
 Doctor's note attached in patient's message.

## 2025-03-02 ENCOUNTER — Ambulatory Visit: Admitting: Family Medicine

## 2025-05-20 ENCOUNTER — Ambulatory Visit: Admitting: Nurse Practitioner
